# Patient Record
Sex: Female | Born: 1935 | Race: White | Hispanic: No | Marital: Married | Smoking: Never smoker
Health system: Southern US, Community
[De-identification: ages and names within clinical notes are randomized; demographics above are authoritative.]

## PROBLEM LIST (undated history)

## (undated) DIAGNOSIS — Z5189 Encounter for other specified aftercare: Secondary | ICD-10-CM

## (undated) DIAGNOSIS — I10 Essential (primary) hypertension: Secondary | ICD-10-CM

## (undated) DIAGNOSIS — G3184 Mild cognitive impairment, so stated: Secondary | ICD-10-CM

## (undated) DIAGNOSIS — E785 Hyperlipidemia, unspecified: Secondary | ICD-10-CM

## (undated) HISTORY — DX: Mild cognitive impairment of uncertain or unknown etiology: G31.84

## (undated) HISTORY — DX: Essential (primary) hypertension: I10

## (undated) HISTORY — DX: Hyperlipidemia, unspecified: E78.5

## (undated) HISTORY — PX: TONSILLECTOMY: SUR1361

## (undated) HISTORY — DX: Encounter for other specified aftercare: Z51.89

---

## 1962-04-26 DIAGNOSIS — Z5189 Encounter for other specified aftercare: Secondary | ICD-10-CM

## 1962-04-26 HISTORY — DX: Encounter for other specified aftercare: Z51.89

## 1997-07-30 ENCOUNTER — Other Ambulatory Visit: Admission: RE | Admit: 1997-07-30 | Discharge: 1997-07-30 | Payer: Self-pay | Admitting: Obstetrics and Gynecology

## 1997-08-15 ENCOUNTER — Ambulatory Visit (HOSPITAL_COMMUNITY): Admission: RE | Admit: 1997-08-15 | Discharge: 1997-08-15 | Payer: Self-pay | Admitting: Family Medicine

## 1998-11-14 ENCOUNTER — Other Ambulatory Visit: Admission: RE | Admit: 1998-11-14 | Discharge: 1998-11-14 | Payer: Self-pay | Admitting: Family Medicine

## 1999-01-01 ENCOUNTER — Encounter (INDEPENDENT_AMBULATORY_CARE_PROVIDER_SITE_OTHER): Payer: Self-pay | Admitting: Specialist

## 1999-01-01 ENCOUNTER — Other Ambulatory Visit: Admission: RE | Admit: 1999-01-01 | Discharge: 1999-01-01 | Payer: Self-pay | Admitting: Internal Medicine

## 1999-10-30 ENCOUNTER — Encounter: Payer: Self-pay | Admitting: Family Medicine

## 1999-10-30 ENCOUNTER — Encounter: Admission: RE | Admit: 1999-10-30 | Discharge: 1999-10-30 | Payer: Self-pay | Admitting: Family Medicine

## 2000-03-03 ENCOUNTER — Other Ambulatory Visit: Admission: RE | Admit: 2000-03-03 | Discharge: 2000-03-03 | Payer: Self-pay | Admitting: Family Medicine

## 2000-12-19 ENCOUNTER — Encounter: Admission: RE | Admit: 2000-12-19 | Discharge: 2000-12-19 | Payer: Self-pay | Admitting: Family Medicine

## 2000-12-19 ENCOUNTER — Encounter: Payer: Self-pay | Admitting: Family Medicine

## 2000-12-28 ENCOUNTER — Encounter: Admission: RE | Admit: 2000-12-28 | Discharge: 2000-12-28 | Payer: Self-pay | Admitting: Family Medicine

## 2000-12-28 ENCOUNTER — Encounter: Payer: Self-pay | Admitting: Family Medicine

## 2001-04-27 ENCOUNTER — Other Ambulatory Visit: Admission: RE | Admit: 2001-04-27 | Discharge: 2001-04-27 | Payer: Self-pay | Admitting: Obstetrics and Gynecology

## 2001-12-29 ENCOUNTER — Encounter: Admission: RE | Admit: 2001-12-29 | Discharge: 2001-12-29 | Payer: Self-pay | Admitting: Family Medicine

## 2001-12-29 ENCOUNTER — Encounter: Payer: Self-pay | Admitting: Family Medicine

## 2002-06-26 ENCOUNTER — Other Ambulatory Visit: Admission: RE | Admit: 2002-06-26 | Discharge: 2002-06-26 | Payer: Self-pay | Admitting: Family Medicine

## 2002-07-03 ENCOUNTER — Encounter: Payer: Self-pay | Admitting: Family Medicine

## 2002-07-03 ENCOUNTER — Encounter: Admission: RE | Admit: 2002-07-03 | Discharge: 2002-07-03 | Payer: Self-pay | Admitting: Family Medicine

## 2003-04-25 ENCOUNTER — Encounter: Admission: RE | Admit: 2003-04-25 | Discharge: 2003-04-25 | Payer: Self-pay | Admitting: Family Medicine

## 2003-07-17 ENCOUNTER — Other Ambulatory Visit: Admission: RE | Admit: 2003-07-17 | Discharge: 2003-07-17 | Payer: Self-pay | Admitting: Family Medicine

## 2004-01-29 ENCOUNTER — Other Ambulatory Visit: Admission: RE | Admit: 2004-01-29 | Discharge: 2004-01-29 | Payer: Self-pay | Admitting: Obstetrics and Gynecology

## 2004-04-29 ENCOUNTER — Encounter: Admission: RE | Admit: 2004-04-29 | Discharge: 2004-04-29 | Payer: Self-pay | Admitting: Family Medicine

## 2004-07-08 ENCOUNTER — Other Ambulatory Visit: Admission: RE | Admit: 2004-07-08 | Discharge: 2004-07-08 | Payer: Self-pay | Admitting: Addiction Medicine

## 2004-12-01 ENCOUNTER — Other Ambulatory Visit: Admission: RE | Admit: 2004-12-01 | Discharge: 2004-12-01 | Payer: Self-pay | Admitting: Obstetrics and Gynecology

## 2005-05-17 ENCOUNTER — Inpatient Hospital Stay (HOSPITAL_COMMUNITY): Admission: EM | Admit: 2005-05-17 | Discharge: 2005-05-18 | Payer: Self-pay | Admitting: Emergency Medicine

## 2005-05-17 ENCOUNTER — Ambulatory Visit: Payer: Self-pay | Admitting: Cardiology

## 2005-06-01 ENCOUNTER — Encounter: Admission: RE | Admit: 2005-06-01 | Discharge: 2005-06-01 | Payer: Self-pay | Admitting: Family Medicine

## 2005-07-14 ENCOUNTER — Other Ambulatory Visit: Admission: RE | Admit: 2005-07-14 | Discharge: 2005-07-14 | Payer: Self-pay | Admitting: Addiction Medicine

## 2006-07-15 ENCOUNTER — Encounter: Admission: RE | Admit: 2006-07-15 | Discharge: 2006-07-15 | Payer: Self-pay | Admitting: Family Medicine

## 2006-07-20 ENCOUNTER — Other Ambulatory Visit: Admission: RE | Admit: 2006-07-20 | Discharge: 2006-07-20 | Payer: Self-pay | Admitting: Obstetrics and Gynecology

## 2007-05-10 ENCOUNTER — Other Ambulatory Visit: Admission: RE | Admit: 2007-05-10 | Discharge: 2007-05-10 | Payer: Self-pay | Admitting: Family Medicine

## 2007-07-25 ENCOUNTER — Encounter: Admission: RE | Admit: 2007-07-25 | Discharge: 2007-07-25 | Payer: Self-pay | Admitting: Family Medicine

## 2007-09-13 ENCOUNTER — Ambulatory Visit: Payer: Self-pay | Admitting: Internal Medicine

## 2007-09-26 ENCOUNTER — Encounter: Payer: Self-pay | Admitting: Internal Medicine

## 2007-09-26 ENCOUNTER — Ambulatory Visit: Payer: Self-pay | Admitting: Internal Medicine

## 2007-09-28 ENCOUNTER — Encounter: Payer: Self-pay | Admitting: Internal Medicine

## 2008-05-22 ENCOUNTER — Encounter: Admission: RE | Admit: 2008-05-22 | Discharge: 2008-05-22 | Payer: Self-pay | Admitting: Family Medicine

## 2008-07-26 ENCOUNTER — Encounter: Admission: RE | Admit: 2008-07-26 | Discharge: 2008-07-26 | Payer: Self-pay | Admitting: Family Medicine

## 2009-07-29 ENCOUNTER — Encounter: Admission: RE | Admit: 2009-07-29 | Discharge: 2009-07-29 | Payer: Self-pay | Admitting: Family Medicine

## 2009-08-05 ENCOUNTER — Ambulatory Visit: Payer: Self-pay | Admitting: Obstetrics and Gynecology

## 2009-08-06 ENCOUNTER — Ambulatory Visit: Payer: Self-pay | Admitting: Obstetrics and Gynecology

## 2009-08-27 ENCOUNTER — Ambulatory Visit: Payer: Self-pay | Admitting: Obstetrics and Gynecology

## 2009-09-02 ENCOUNTER — Ambulatory Visit: Payer: Self-pay | Admitting: Obstetrics and Gynecology

## 2009-09-02 ENCOUNTER — Ambulatory Visit (HOSPITAL_BASED_OUTPATIENT_CLINIC_OR_DEPARTMENT_OTHER): Admission: RE | Admit: 2009-09-02 | Discharge: 2009-09-02 | Payer: Self-pay | Admitting: Obstetrics and Gynecology

## 2009-09-09 ENCOUNTER — Ambulatory Visit: Payer: Self-pay | Admitting: Obstetrics and Gynecology

## 2010-05-17 ENCOUNTER — Encounter: Payer: Self-pay | Admitting: Family Medicine

## 2010-06-09 ENCOUNTER — Other Ambulatory Visit: Payer: Self-pay | Admitting: Family Medicine

## 2010-06-09 DIAGNOSIS — Z780 Asymptomatic menopausal state: Secondary | ICD-10-CM

## 2010-06-16 ENCOUNTER — Other Ambulatory Visit: Payer: Self-pay

## 2010-06-23 ENCOUNTER — Ambulatory Visit
Admission: RE | Admit: 2010-06-23 | Discharge: 2010-06-23 | Disposition: A | Discharge status: Home / Self Care | Payer: MEDICARE | Source: Ambulatory Visit | Attending: Family Medicine | Admitting: Family Medicine

## 2010-06-23 DIAGNOSIS — Z780 Asymptomatic menopausal state: Secondary | ICD-10-CM

## 2010-07-01 ENCOUNTER — Other Ambulatory Visit: Payer: Self-pay | Admitting: Family Medicine

## 2010-07-01 DIAGNOSIS — Z1231 Encounter for screening mammogram for malignant neoplasm of breast: Secondary | ICD-10-CM

## 2010-07-15 ENCOUNTER — Ambulatory Visit
Admission: RE | Admit: 2010-07-15 | Discharge: 2010-07-15 | Disposition: A | Discharge status: Home / Self Care | Payer: MEDICARE | Source: Ambulatory Visit | Attending: Family Medicine | Admitting: Family Medicine

## 2010-07-15 DIAGNOSIS — Z1231 Encounter for screening mammogram for malignant neoplasm of breast: Secondary | ICD-10-CM

## 2011-06-09 ENCOUNTER — Other Ambulatory Visit: Payer: Self-pay | Admitting: Family Medicine

## 2011-06-09 DIAGNOSIS — Z1231 Encounter for screening mammogram for malignant neoplasm of breast: Secondary | ICD-10-CM

## 2011-07-21 ENCOUNTER — Ambulatory Visit
Admission: RE | Admit: 2011-07-21 | Discharge: 2011-07-21 | Disposition: A | Discharge status: Home / Self Care | Payer: Medicare Other | Source: Ambulatory Visit | Attending: Family Medicine | Admitting: Family Medicine

## 2011-07-21 DIAGNOSIS — Z1231 Encounter for screening mammogram for malignant neoplasm of breast: Secondary | ICD-10-CM

## 2011-07-26 ENCOUNTER — Other Ambulatory Visit: Payer: Self-pay | Admitting: Family Medicine

## 2011-07-26 DIAGNOSIS — R928 Other abnormal and inconclusive findings on diagnostic imaging of breast: Secondary | ICD-10-CM

## 2011-07-30 ENCOUNTER — Ambulatory Visit
Admission: RE | Admit: 2011-07-30 | Discharge: 2011-07-30 | Disposition: A | Discharge status: Home / Self Care | Payer: Medicare Other | Source: Ambulatory Visit | Attending: Family Medicine | Admitting: Family Medicine

## 2011-07-30 DIAGNOSIS — R928 Other abnormal and inconclusive findings on diagnostic imaging of breast: Secondary | ICD-10-CM

## 2012-07-04 ENCOUNTER — Other Ambulatory Visit: Payer: Self-pay

## 2012-07-04 DIAGNOSIS — Z1231 Encounter for screening mammogram for malignant neoplasm of breast: Secondary | ICD-10-CM

## 2012-08-08 ENCOUNTER — Ambulatory Visit: Payer: Medicare Other

## 2012-08-08 ENCOUNTER — Ambulatory Visit
Admission: RE | Admit: 2012-08-08 | Discharge: 2012-08-08 | Disposition: A | Discharge status: Home / Self Care | Payer: Medicare Other | Source: Ambulatory Visit

## 2012-08-08 DIAGNOSIS — Z1231 Encounter for screening mammogram for malignant neoplasm of breast: Secondary | ICD-10-CM

## 2012-09-12 ENCOUNTER — Ambulatory Visit: Payer: Medicare Other

## 2012-11-08 ENCOUNTER — Encounter: Payer: Self-pay | Admitting: Internal Medicine

## 2013-03-06 ENCOUNTER — Encounter: Payer: Self-pay | Admitting: Internal Medicine

## 2013-04-13 ENCOUNTER — Ambulatory Visit (AMBULATORY_SURGERY_CENTER): Payer: Self-pay | Admitting: *Deleted

## 2013-04-13 VITALS — Ht 59.0 in | Wt 112.6 lb

## 2013-04-13 DIAGNOSIS — Z86010 Personal history of colonic polyps: Secondary | ICD-10-CM

## 2013-04-13 MED ORDER — MOVIPREP 100 G PO SOLR: ORAL | Status: DC

## 2013-04-13 NOTE — Progress Notes (Signed)
No allergies to eggs or soy. No problems with anesthesia.  

## 2013-04-16 ENCOUNTER — Encounter: Payer: Self-pay | Admitting: Internal Medicine

## 2013-05-09 ENCOUNTER — Telehealth: Payer: Self-pay | Admitting: Internal Medicine

## 2013-05-09 NOTE — Telephone Encounter (Signed)
Per Alesia BandaSherri Jones RN, ok to stay on clear liquids and then drink 2nd half of prep in morning at 330 am like instructions states, called pt back to advise, pt states verbalized understanding of instructions. Also reiterated, not to drink anything after 530 am in the morning, all medications have to be taken before cut off time. Pt states understanding-adm

## 2013-05-09 NOTE — Telephone Encounter (Signed)
Called pt back to confirm information, pt drank 1st dose of prep starting at 330 am this morning, she was not suppose to start prep until this evening. Pt states she has been on clear liquids all day, pt reports last stool was dark watery brown, should pt stay on clear liquids and drink 2nd dose at 330 am tomorrow morning 05/10/13 as stated in directions or do you want her to do something different tonight? Please advise-adm

## 2013-05-10 ENCOUNTER — Ambulatory Visit (AMBULATORY_SURGERY_CENTER): Payer: Medicare HMO | Admitting: Internal Medicine

## 2013-05-10 ENCOUNTER — Encounter: Payer: Self-pay | Admitting: Internal Medicine

## 2013-05-10 VITALS — BP 135/66 | HR 72 | Temp 98.7°F | Resp 20 | Ht 59.0 in | Wt 112.0 lb

## 2013-05-10 DIAGNOSIS — Z86010 Personal history of colonic polyps: Secondary | ICD-10-CM

## 2013-05-10 DIAGNOSIS — Z800 Family history of malignant neoplasm of digestive organs: Secondary | ICD-10-CM

## 2013-05-10 MED ORDER — SODIUM CHLORIDE 0.9 % IV SOLN: 500.0000 mL | INTRAVENOUS | Status: DC

## 2013-05-10 NOTE — Progress Notes (Signed)
A/ox3 pleased with MAC, report to Annette RN 

## 2013-05-10 NOTE — Progress Notes (Signed)
No complaints noted in the recovery room. Maw   

## 2013-05-10 NOTE — Patient Instructions (Signed)
YOU HAD AN ENDOSCOPIC PROCEDURE TODAY AT THE [facility]: Refer to the procedure report that was given to you for any specific questions about what was found during the examination.  If the procedure report does not answer your questions, please call your gastroenterologist to clarify.  If you requested that your care partner not be given the details of your procedure findings, then the procedure report has been included in a sealed envelope for you to review at your convenience later.  YOU SHOULD EXPECT: Some feelings of bloating in the abdomen. Passage of more gas than usual.  Walking can help get rid of the air that was put into your GI tract during the procedure and reduce the bloating. If you had a lower endoscopy (such as a colonoscopy or flexible sigmoidoscopy) you may notice spotting of blood in your stool or on the toilet paper. If you underwent a bowel prep for your procedure, then you may not have a normal bowel movement for a few days.  DIET: Your first meal following the procedure should be a light meal and then it is ok to progress to your normal diet.  A half-sandwich or bowl of soup is an example of a good first meal.  Heavy or fried foods are harder to digest and may make you feel nauseous or bloated.  Likewise meals heavy in dairy and vegetables can cause extra gas to form and this can also increase the bloating.  Drink plenty of fluids but you should avoid alcoholic beverages for 24 hours.  ACTIVITY: Your care partner should take you home directly after the procedure.  You should plan to take it easy, moving slowly for the rest of the day.  You can resume normal activity the day after the procedure however you should NOT DRIVE or use heavy machinery for 24 hours (because of the sedation medicines used during the test).    SYMPTOMS TO REPORT IMMEDIATELY: A gastroenterologist can be reached at any hour.  During normal business hours, 8:30 AM to 5:00 PM Monday through Friday,  call [phone].  After hours and on weekends, please call the GI answering service at [phone] who will take a message and have the physician on call contact you.   Following lower endoscopy (colonoscopy or flexible sigmoidoscopy):  Excessive amounts of blood in the stool  Significant tenderness or worsening of abdominal pains  Swelling of the abdomen that is new, acute  Fever of 100F or higher    FOLLOW UP: If any biopsies were taken you will be contacted by phone or by letter within the next 1-3 weeks.  Call your gastroenterologist if you have not heard about the biopsies in 3 weeks.  Our staff will call the home number listed on your records the next business day following your procedure to check on you and address any questions or concerns that you may have at that time regarding the information given to you following your procedure. This is a courtesy call and so if there is no answer at the home number and we have not heard from you through the emergency physician on call, we will assume that you have returned to your regular daily activities without incident.  SIGNATURES/CONFIDENTIALITY: You and/or your care partner have signed paperwork which will be entered into your electronic medical record.  These signatures attest to the fact that that the information above on your After Visit Summary has been reviewed and is understood.  Full responsibility of the confidentiality   of this discharge information lies with you and/or your care-partner.   Handouts were given to your care partner on diverticulosis and a high fiber diet. You may resume your current medications today. Please call if any questions or concerns.

## 2013-05-10 NOTE — Op Note (Signed)
[facility] [address] [place], [id number]   COLONOSCOPY PROCEDURE REPORT  PATIENT: Cathy RiegerSarwi, Sung A.  MR#: [id number] BIRTHDATE: 11/30/1935 , 77  yrs. old GENDER: Female ENDOSCOPIST: Roxy CedarJohn N Tomaz Janis Jr, MD REFERRED XB:[id number]:Surveillance Program Recall PROCEDURE DATE:  05/10/2013 PROCEDURE:   Colonoscopy, surveillance First Screening Colonoscopy - Avg.  risk and is 50 yrs.  old or older - No.  Prior Negative Screening - Now for repeat screening. N/A  History of Adenoma - Now for follow-up colonoscopy & has been > or = to 3 yrs.  Yes hx of adenoma.  Has been 3 or more years since last colonoscopy.  Polyps Removed Today? No.  Recommend repeat exam, <10 yrs? No. ASA CLASS:   Class II INDICATIONS:Patient's immediate family history of colon cancer (Brother 7550's) and Patient's personal history of adenomatous colon polyps. Index 200 w/ TVA; F/U 2004, 2009 w/ TAs. MEDICATIONS: MAC sedation, administered by CRNA and propofol (Diprivan) 150mg  IV  DESCRIPTION OF PROCEDURE:   After the risks benefits and alternatives of the procedure were thoroughly explained, informed consent was obtained.  A digital rectal exam revealed no abnormalities of the rectum.   The LB [id number]  endoscope was introduced through the anus and advanced to the cecum, which was identified by both the appendix and ileocecal valve. No adverse events experienced.   The quality of the prep was excellent, using MoviPrep  The instrument was then slowly withdrawn as the colon was fully examined.  COLON FINDINGS: Moderate diverticulosis was noted in the sigmoid colon.   The colon was otherwise normal.  There was no inflammation, polyps or cancers unless previously stated. Retroflexed views revealed internal hemorrhoids. The time to cecum=3 minutes 14 seconds.  Withdrawal time=6 minutes 49 seconds. The scope was withdrawn and the procedure completed.  COMPLICATIONS: There were no  complications.  ENDOSCOPIC IMPRESSION: 1.   Moderate diverticulosis was noted in the sigmoid colon 2.   The colon was otherwise normal  RECOMMENDATIONS: 1. Return to the care of your primary provider.  GI follow up as needed   eSigned:  Roxy CedarJohn N Breshae Belcher Jr, MD 05/10/2013 9:08 AM   cc: Halina MaidensSheila C Stallings, MD and The Patient

## 2013-05-11 ENCOUNTER — Telehealth: Payer: Self-pay | Admitting: *Deleted

## 2013-05-11 NOTE — Telephone Encounter (Signed)
  Follow up Call-  Call back number 05/10/2013  Post procedure Call Back phone  # [phone]  Permission to leave phone message Yes     Patient questions:  Do you have a fever, pain , or abdominal swelling? no Pain Score  0 *  Have you tolerated food without any problems? yes  Have you been able to return to your normal activities? yes  Do you have any questions about your discharge instructions: Diet   no Medications  no Follow up visit  no  Do you have questions or concerns about your Care? no  Actions: * If pain score is 4 or above: No action needed, pain <4.

## 2013-06-14 ENCOUNTER — Telehealth: Payer: Self-pay

## 2013-06-14 NOTE — Telephone Encounter (Signed)
i would be happy to see her.

## 2013-06-14 NOTE — Telephone Encounter (Signed)
Mrs. Cathy FerronBarbara Slocumb would like to become one of your patients, she is now seeing Dr. Creta LevinStallings which is leaving her practice. [name] was a patient of yours. Could someone call and let her know if this is possible. She just had her complete CPE last week and needs to let the practice know where to send her records.

## 2013-06-14 NOTE — Telephone Encounter (Signed)
Pt husband was a patient of yours before he passed away. If not willing to see patient she would be fine with seeing a woman physician.

## 2013-06-15 NOTE — Telephone Encounter (Signed)
Can you please call and schedule this patient for a New Patient appt. Thank you

## 2013-07-12 ENCOUNTER — Other Ambulatory Visit: Payer: Self-pay

## 2013-07-12 DIAGNOSIS — Z1231 Encounter for screening mammogram for malignant neoplasm of breast: Secondary | ICD-10-CM

## 2013-08-10 ENCOUNTER — Ambulatory Visit
Admission: RE | Admit: 2013-08-10 | Discharge: 2013-08-10 | Disposition: A | Discharge status: Home / Self Care | Payer: Commercial Managed Care - HMO | Source: Ambulatory Visit

## 2013-08-10 DIAGNOSIS — Z1231 Encounter for screening mammogram for malignant neoplasm of breast: Secondary | ICD-10-CM

## 2013-09-19 ENCOUNTER — Ambulatory Visit: Payer: Medicare HMO | Admitting: Internal Medicine

## 2013-12-26 ENCOUNTER — Encounter: Payer: Self-pay | Admitting: Internal Medicine

## 2014-06-17 ENCOUNTER — Other Ambulatory Visit: Payer: Self-pay | Admitting: Family Medicine

## 2014-06-17 DIAGNOSIS — Z780 Asymptomatic menopausal state: Secondary | ICD-10-CM

## 2014-06-17 DIAGNOSIS — M8580 Other specified disorders of bone density and structure, unspecified site: Secondary | ICD-10-CM

## 2014-06-17 DIAGNOSIS — Z1231 Encounter for screening mammogram for malignant neoplasm of breast: Secondary | ICD-10-CM

## 2014-08-12 ENCOUNTER — Ambulatory Visit
Admission: RE | Admit: 2014-08-12 | Discharge: 2014-08-12 | Disposition: A | Discharge status: Home / Self Care | Payer: Commercial Managed Care - HMO | Source: Ambulatory Visit | Attending: Family Medicine | Admitting: Family Medicine

## 2014-08-12 DIAGNOSIS — M8580 Other specified disorders of bone density and structure, unspecified site: Secondary | ICD-10-CM

## 2014-08-12 DIAGNOSIS — Z780 Asymptomatic menopausal state: Secondary | ICD-10-CM

## 2014-08-12 DIAGNOSIS — Z1231 Encounter for screening mammogram for malignant neoplasm of breast: Secondary | ICD-10-CM

## 2014-12-12 ENCOUNTER — Other Ambulatory Visit: Payer: Self-pay | Admitting: Family Medicine

## 2014-12-12 DIAGNOSIS — R413 Other amnesia: Secondary | ICD-10-CM

## 2014-12-12 DIAGNOSIS — W19XXXA Unspecified fall, initial encounter: Secondary | ICD-10-CM

## 2014-12-17 ENCOUNTER — Ambulatory Visit
Admission: RE | Admit: 2014-12-17 | Discharge: 2014-12-17 | Disposition: A | Discharge status: Home / Self Care | Payer: Commercial Managed Care - HMO | Source: Ambulatory Visit | Attending: Family Medicine | Admitting: Family Medicine

## 2014-12-17 DIAGNOSIS — W19XXXA Unspecified fall, initial encounter: Secondary | ICD-10-CM

## 2014-12-17 DIAGNOSIS — R413 Other amnesia: Secondary | ICD-10-CM

## 2015-07-17 ENCOUNTER — Other Ambulatory Visit: Payer: Self-pay | Admitting: Family Medicine

## 2015-07-17 DIAGNOSIS — M7989 Other specified soft tissue disorders: Secondary | ICD-10-CM

## 2015-07-18 ENCOUNTER — Ambulatory Visit
Admission: RE | Admit: 2015-07-18 | Discharge: 2015-07-18 | Disposition: A | Discharge status: Home / Self Care | Payer: Commercial Managed Care - HMO | Source: Ambulatory Visit | Attending: Family Medicine | Admitting: Family Medicine

## 2015-07-18 DIAGNOSIS — M7989 Other specified soft tissue disorders: Secondary | ICD-10-CM

## 2015-11-07 LAB — GLUCOSE, POCT (MANUAL RESULT ENTRY): POC GLUCOSE: 106 mg/dL — AB (ref 70–99)

## 2016-06-18 DIAGNOSIS — H903 Sensorineural hearing loss, bilateral: Secondary | ICD-10-CM

## 2016-06-18 DIAGNOSIS — H6123 Impacted cerumen, bilateral: Secondary | ICD-10-CM | POA: Insufficient documentation

## 2016-06-18 HISTORY — DX: Sensorineural hearing loss, bilateral: H90.3

## 2017-07-20 ENCOUNTER — Encounter: Payer: Self-pay | Admitting: Neurology

## 2017-09-14 ENCOUNTER — Ambulatory Visit: Payer: Medicare Other | Admitting: Neurology

## 2017-09-14 ENCOUNTER — Other Ambulatory Visit: Payer: Self-pay

## 2017-09-14 ENCOUNTER — Encounter: Payer: Self-pay | Admitting: Neurology

## 2017-09-14 VITALS — BP 138/66 | HR 80 | Ht 59.0 in | Wt 116.0 lb

## 2017-09-14 DIAGNOSIS — R413 Other amnesia: Secondary | ICD-10-CM | POA: Diagnosis not present

## 2017-09-14 NOTE — Patient Instructions (Signed)
Great meeting you. Follow-up in 6 months, call for any changes  RECOMMENDATIONS FOR ALL PATIENTS WITH MEMORY PROBLEMS: 1. Continue to exercise (Recommend 30 minutes of walking everyday, or 3 hours every week) 2. Increase social interactions - continue going to Church and enjoy social gatherings with friends and family 3. Eat healthy, avoid fried foods and eat more fruits and vegetables 4. Maintain adequate blood pressure, blood sugar, and blood cholesterol level. Reducing the risk of stroke and cardiovascular disease also helps promoting better memory. 5. Avoid stressful situations. Live a simple life and avoid aggravations. Organize your time and prepare for the next day in anticipation. 6. Sleep well, avoid any interruptions of sleep and avoid any distractions in the bedroom that may interfere with adequate sleep quality 7. Avoid sugar, avoid sweets as there is a strong link between excessive sugar intake, diabetes, and cognitive impairment We discussed the Mediterranean diet, which has been shown to help patients reduce the risk of progressive memory disorders and reduces cardiovascular risk. This includes eating fish, eat fruits and green leafy vegetables, nuts like almonds and hazelnuts, walnuts, and also use olive oil. Avoid fast foods and fried foods as much as possible. Avoid sweets and sugar as sugar use has been linked to worsening of memory function.   

## 2017-09-14 NOTE — Progress Notes (Signed)
NEUROLOGY CONSULTATION NOTE  Cathy Grimes MRN: [id number] DOB: 03/07/1936  Referring provider: Dr. Shirlean Mylar Primary care provider: Dr. Shirlean Mylar  Reason for consult:  Memory loss  Dear Dr Hyman Hopes:  Thank you for your kind referral of Cathy Grimes for consultation of the above symptoms. Although her history is well known to you, please allow me to reiterate it for the purpose of our medical record. She is alone in the office today. Records and images were personally reviewed where available.  HISTORY OF PRESENT ILLNESS: This is a pleasant 82 year old right-handed woman with a history of hypertension, hyperlipidemia, presenting for evaluation of memory loss. She notes her memory at times does bother her. Her son has been staying with her for a few months and would remind her that they had already seen the same show a few weeks ago. Her children would tell her she repeats herself. She lives alone and denies getting lost driving, no missed bills or medications. No word-finding difficulties. She denies misplacing things frequently or leaving the stove on. She is active and exercises regularly. No report of personality changes, she denies any hallucinations. She is independent with all ADLs. MMSE at PCP office in March 2019 was 23/30.  She has noticed her sense of smell is not as keen as others, otherwise she denies any headaches, dizziness, diplopia, dysarthria/dysphagia, neck/back pain, focal numbness/tingling/weakness, bowel/bladder dysfunction, or tremors. There is no family history of dementia. She denies any history of significant head injuries. She drinks alcohol socially.   Laboratory Data: TSH and B12 done at PCP office, report unavailable for review  PAST MEDICAL HISTORY: Past Medical History:  Diagnosis Date  . Blood transfusion without reported diagnosis 1964   after miscarriage  . Hyperlipidemia   . Hypertension     PAST SURGICAL HISTORY: Past Surgical History:    Procedure Laterality Date  . TONSILLECTOMY      MEDICATIONS: Current Outpatient Medications on File Prior to Visit  Medication Sig Dispense Refill  . Atorvastatin Calcium (LIPITOR PO) Take by mouth at bedtime.    . Calcium Carbonate-Vitamin D (CALCIUM + D PO) Take by mouth 2 (two) times daily.    . Multiple Vitamin (MULTIVITAMIN) tablet Take 1 tablet by mouth daily.    . Omega-3 Fatty Acids (FISH OIL PO) Take by mouth 3 (three) times daily.    Marland Kitchen oxybutynin (DITROPAN) 5 MG tablet Take 5 mg by mouth 3 (three) times daily.    . sertraline (ZOLOFT) 50 MG tablet     . telmisartan (MICARDIS) 40 MG tablet TK 1 T PO QD  0   No current facility-administered medications on file prior to visit.     ALLERGIES: No Known Allergies  FAMILY HISTORY: Family History  Problem Relation Age of Onset  . Colon cancer Neg Hx     SOCIAL HISTORY: Social History   Socioeconomic History  . Marital status: Married    Spouse name: Not on file  . Number of children: Not on file  . Years of education: Not on file  . Highest education level: Not on file  Occupational History  . Not on file  Social Needs  . Financial resource strain: Not on file  . Food insecurity:    Worry: Not on file    Inability: Not on file  . Transportation needs:    Medical: Not on file    Non-medical: Not on file  Tobacco Use  . Smoking status: Never Smoker  .  Smokeless tobacco: Never Used  Substance and Sexual Activity  . Alcohol use: Yes    Alcohol/week: 8.4 oz    Types: 14 Glasses of wine per week  . Drug use: No  . Sexual activity: Not on file  Lifestyle  . Physical activity:    Days per week: Not on file    Minutes per session: Not on file  . Stress: Not on file  Relationships  . Social connections:    Talks on phone: Not on file    Gets together: Not on file    Attends religious service: Not on file    Active member of club or organization: Not on file    Attends meetings of clubs or organizations: Not  on file    Relationship status: Not on file  . Intimate partner violence:    Fear of current or ex partner: Not on file    Emotionally abused: Not on file    Physically abused: Not on file    Forced sexual activity: Not on file  Other Topics Concern  . 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    REVIEW OF SYSTEMS: Constitutional: No fevers, chills, or sweats, no generalized fatigue, change in appetite Eyes: No visual changes, double vision, eye pain Ear, nose and throat: No hearing loss, ear pain, nasal congestion, sore throat Cardiovascular: No chest pain, palpitations Respiratory:  No shortness of breath at rest or with exertion, wheezes GastrointestinaI: No nausea, vomiting, diarrhea, abdominal pain, fecal incontinence Genitourinary:  No dysuria, urinary retention or frequency Musculoskeletal:  No neck pain, back pain Integumentary: No rash, pruritus, skin lesions Neurological: as above Psychiatric: No depression, insomnia, anxiety Endocrine: No palpitations, fatigue, diaphoresis, mood swings, change in appetite, change in weight, increased thirst Hematologic/Lymphatic:  No anemia, purpura, petechiae. Allergic/Immunologic: no itchy/runny eyes, nasal congestion, recent allergic reactions, rashes  PHYSICAL EXAM: Vitals:   09/14/17 0855  BP: 138/66  Pulse: 80  SpO2: 95%   General: No acute distress Head:  Normocephalic/atraumatic Eyes: Fundoscopic exam shows bilateral sharp discs, no vessel changes, exudates, or hemorrhages Neck: supple, no paraspinal tenderness, full range of motion Back: No paraspinal tenderness Heart: regular rate and rhythm Lungs: Clear to auscultation bilaterally. Vascular: No carotid bruits. Skin/Extremities: No rash, no edema Neurological Exam: Mental status: alert and oriented to person, place, and time, no dysarthria or aphasia, Fund of knowledge is  appropriate.  Recent and remote memory are intact.  Attention and concentration are normal.    Able to name objects and repeat phrases.  Montreal Cognitive Assessment  09/14/2017  Visuospatial/ Executive (0/5) 4  Naming (0/3) 3  Attention: Read list of digits (0/2) 2  Attention: Read list of letters (0/1) 1  Attention: Serial 7 subtraction starting at 100 (0/3) 3  Language: Repeat phrase (0/2) 2  Language : Fluency (0/1) 1  Abstraction (0/2) 2  Delayed Recall (0/5) 2  Orientation (0/6) 6  Total 26   Cranial nerves: CN I: not tested CN II: pupils equal, round and reactive to light, visual fields intact, fundi unremarkable. CN III, IV, VI:  full range of motion, no nystagmus, no ptosis CN V: facial sensation intact CN VII: upper and lower face symmetric CN VIII: hearing intact to finger rub CN IX, X: gag intact, uvula midline CN XI: sternocleidomastoid and trapezius muscles intact CN XII: tongue midline Bulk & Tone:  normal, no fasciculations. Motor: 5/5 throughout with no pronator drift. Sensation: intact to light touch, cold, pin, vibration and joint position sense.  No extinction to double simultaneous stimulation.  Romberg test negative Deep Tendon Reflexes: +2 throughout, no ankle clonus Plantar responses: downgoing bilaterally Cerebellar: no incoordination on finger to nose, heel to shin. No dysdiadochokinesia Gait: narrow-based and steady, mild difficulty with tandem walk but able Tremor: none  IMPRESSION: This is a pleasant 82 year old right-handed woman with a history of hypertension, hyperlipidemia, presenting for evaluation of memory loss. Her neurological exam is non-focal, MOCA score today normal 26/30. She denies any difficulties with complex tasks, symptoms suggestive of age-related memory loss. We discussed medications such as cholinesterase inhibitors, side effects and expectations, and have agreed to monitor memory for now and if any decline on next visit, we will plan  to start medication then. We discussed the importance of control of vascular risk factors, physical exercise, and brain stimulation exercises for brain health. She will follow-up in 6 months and knows to call for any changes.  Thank you for allowing me to participate in the care of this patient. Please do not hesitate to call for any questions or concerns.   Patrcia Dolly, M.D.  CC: Dr. Hyman Hopes

## 2018-04-05 ENCOUNTER — Ambulatory Visit: Payer: Medicare Other | Admitting: Neurology

## 2018-08-10 ENCOUNTER — Ambulatory Visit: Payer: Medicare Other | Admitting: Neurology

## 2019-03-21 ENCOUNTER — Ambulatory Visit: Payer: Medicare Other | Admitting: Neurology

## 2019-03-21 ENCOUNTER — Encounter

## 2019-03-26 ENCOUNTER — Telehealth (INDEPENDENT_AMBULATORY_CARE_PROVIDER_SITE_OTHER): Payer: Medicare Other | Admitting: Neurology

## 2019-03-26 ENCOUNTER — Encounter: Payer: Self-pay | Admitting: Neurology

## 2019-03-26 ENCOUNTER — Other Ambulatory Visit: Payer: Self-pay

## 2019-03-26 VITALS — Ht 59.0 in

## 2019-03-26 DIAGNOSIS — G3184 Mild cognitive impairment, so stated: Secondary | ICD-10-CM

## 2019-03-26 DIAGNOSIS — R413 Other amnesia: Secondary | ICD-10-CM | POA: Diagnosis not present

## 2019-03-26 MED ORDER — DONEPEZIL HCL 5 MG PO TABS: ORAL_TABLET | ORAL | 11 refills | Status: DC
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
## 2019-05-25 ENCOUNTER — Ambulatory Visit: Payer: Medicare Other

## 2019-05-30 ENCOUNTER — Ambulatory Visit: Payer: Medicare Other

## 2019-05-31 ENCOUNTER — Ambulatory Visit: Payer: Medicare Other | Attending: Internal Medicine

## 2019-05-31 DIAGNOSIS — Z23 Encounter for immunization: Secondary | ICD-10-CM | POA: Insufficient documentation

## 2019-05-31 NOTE — Progress Notes (Signed)
   Covid-19 Vaccination Clinic  Name:  Cathy Grimes    MRN: [id number] DOB: [birth year]  05/31/2019  [name] was observed post Covid-19 immunization for 15 minutes without incidence. She was provided with Vaccine Information Sheet and instruction to access the V-Safe system.   [name] was instructed to call 911 with any severe reactions post vaccine: Marland Kitchen Difficulty breathing  . Swelling of your face and throat  . A fast heartbeat  . A bad rash all over your body  . Dizziness and weakness    Immunizations Administered    Name Date Dose VIS Date Route   Pfizer COVID-19 Vaccine 05/31/2019  9:51 AM 0.3 mL 04/06/2019 Intramuscular   Manufacturer: [employer], [employer]   Lot: [id number]   NDC: [address]-9

## 2019-06-06 ENCOUNTER — Ambulatory Visit (INDEPENDENT_AMBULATORY_CARE_PROVIDER_SITE_OTHER): Payer: Medicare Other | Admitting: Psychology

## 2019-06-06 ENCOUNTER — Encounter: Payer: Self-pay | Admitting: Psychology

## 2019-06-06 ENCOUNTER — Ambulatory Visit: Payer: Medicare Other | Admitting: Psychology

## 2019-06-06 ENCOUNTER — Other Ambulatory Visit: Payer: Self-pay

## 2019-06-06 DIAGNOSIS — G3184 Mild cognitive impairment, so stated: Secondary | ICD-10-CM

## 2019-06-06 DIAGNOSIS — R413 Other amnesia: Secondary | ICD-10-CM

## 2019-06-06 DIAGNOSIS — E785 Hyperlipidemia, unspecified: Secondary | ICD-10-CM

## 2019-06-06 DIAGNOSIS — I10 Essential (primary) hypertension: Secondary | ICD-10-CM

## 2019-06-06 NOTE — Progress Notes (Addendum)
NEUROPSYCHOLOGICAL EVALUATION [place]. [facility] Department of Neurology  Reason for Referral:   Cathy RiegerBarbara A Grimes is a 84 y.o. Caucasian female referred by Patrcia DollyKaren Aquino, M.D., to characterize her current cognitive functioning and assist with diagnostic clarity and treatment planning in the context of subjective cognitive decline.  Assessment and Plan:   Clinical Impression(s): Ms. Valentino Grimes's pattern of performance is suggestive of a primary impairment surrounding some aspects of executive functioning (namely visuomotor cognitive flexibility and response inhibition) and information retrieval across memory measures. An additional isolated impairment was seen across a task assessing line orientations. Performance was appropriate across domains of processing speed, attention/concentration, semantic fluency set shifting, receptive and expressive language, other aspects of visuospatial functioning, and both encoding and consolidation aspects of memory. Cathy Grimes largely denied difficulties completing instrumental activities of daily living (ADLs) independently. As such, given cognitive dysfunction described above, she meets criteria for a Mild Neurocognitive Disorder (formerly "mild cognitive impairment") at the present time.  The etiology for her weaknesses, especially those related to memory retrieval deficits is unclear. Neuroimaging suggesting small vessel ischemia can sometimes create difficulties in this area. While a head CT in 2016 suggested minimal small vessel disease, more recent neuroimaging was not found during record review. Cathy Grimes denied significant psychiatric distress, as well as symptoms of chronic pain, frequent headaches, or sleep disturbances which can also cause mild cognitive inefficiencies. Importantly, despite poor retrieval rates across memory measures, her consolidation scores were largely appropriate. This, accompanied by strong performances in other  cognitive domains, is not consistent with the typical presentation of Alzheimer's disease. Cognitive and behavioral characteristics are also not strongly consistent with other forms of neurodegenerative illness at the present time. Continued medical monitoring will be important moving forward.   Recommendations: A repeat neuropsychological evaluation in 18-24 months (or sooner if functional decline is noted) is recommended to assess the trajectory of future cognitive decline should it occur. This will also aid in future efforts towards improved diagnostic clarity.  If not already performed, updated neuroimaging (ideally a brain MRI) would be beneficial in assessing anatomical correlates for her pattern of weaknesses across testing, as well as the potential progression of small vessel ischemia.   Cathy Grimes is encouraged to attend to lifestyle factors for brain health (e.g., regular physical exercise, good nutrition habits, regular participation in cognitively-stimulating activities, and general stress management techniques), which are likely to have benefits for both emotional adjustment and cognition. Optimal control of vascular risk factors (including safe cardiovascular exercise and adherence to dietary recommendations) is encouraged. Continued participation in activities which provide mental stimulation and social interaction is also recommended.   For day-to-day problems recalling information, she may benefit from using strategies to aid with her learning and memory, such as asking questions for clarification, requesting that information to be repeated, or repeating an explanation in her own words to ensure comprehension and promote encoding. Reducing anxiety/stress may also aid in the retrieval of information.  Memory can be improved using internal strategies such as rehearsal, repetition, chunking, mnemonics, association, and imagery. External strategies such as written notes in a consistently used  memory journal, visual and nonverbal auditory cues such as a calendar on the refrigerator or appointments with alarm, such as on a cell phone, can also help maximize recall.    To address problems with executive functioning, she may wish to consider:   -Avoiding external distractions when needing to concentrate   -Limiting exposure to fast paced environments with multiple sensory demands   -  Writing down complicated information and using checklists   -Attempting and completing one task at a time (i.e., no multi-tasking)   -Verbalizing aloud each step of a task to maintain focus   -Reducing the amount of information considered at one time  Review of Records:   Cathy Grimes was seen by [facility] Marland KitchenEllouise Newer, M.D.) on 03/26/2019 for follow-up of memory concerns. Since her prior visit, Cathy Grimes described her memory as "sometimes good, sometimes bad." Her daughter noted that her mother has been repeating the same question and has experienced generalized forgetfulness for the past 6-12 months. ADLs were described as intact. Performance across a brief cognitive screening instrument over the phone (abbreviated SLUMS) was 21/24. Performance on a previous MOCA was 26/30. Ultimately, Cathy Grimes was referred for a comprehensive neuropsychological evaluation to characterize her cognitive abilities and to assist with diagnostic clarity and treatment planning.  Head CT on 12/17/2014 revealed mild atrophy and minimal chronic small vessel white matter ischemic changes.   Past Medical History:  Diagnosis Date  . Blood transfusion without reported diagnosis 1964   after miscarriage  . Hyperlipidemia   . Hypertension   . Sensorineural hearing loss (SNHL) of both ears 06/18/2016    Past Surgical History:  Procedure Laterality Date  . TONSILLECTOMY      Current Outpatient Medications:  .  aspirin EC 81 MG tablet, Take 81 mg by mouth daily., Disp: , Rfl:  .  Atorvastatin Calcium (LIPITOR PO), Take by  mouth at bedtime., Disp: , Rfl:  .  Calcium Carbonate-Vitamin D (CALCIUM + D PO), Take by mouth 2 (two) times daily., Disp: , Rfl:  .  donepezil (ARICEPT) 5 MG tablet, Take 1/2 tablet daily for 2 weeks, then increase to 1 tablet daily, Disp: 30 tablet, Rfl: 11 .  Multiple Vitamin (MULTIVITAMIN) tablet, Take 1 tablet by mouth daily., Disp: , Rfl:  .  Omega-3 Fatty Acids (FISH OIL PO), Take by mouth 3 (three) times daily., Disp: , Rfl:   Clinical Interview:   Cognitive Symptoms: Decreased short-term memory: Endorsed. She described symptoms of generalized forgetfulness, including trouble remembering upcoming appointments if she does not write them down. She denied trouble remembering details of previous conversations or the names of familiar individuals. She estimated that memory changes have been ongoing for the past 1-2 years and have seemed stable over that time.  Decreased long-term memory: Denied. Decreased attention/concentration: Denied. She did acknowledge a longstanding weakness with distractibility, but denied any changes over time.  Reduced processing speed: Denied. Difficulties with executive functions: Denied. She did acknowledge a longstanding weakness with impulsivity at times throughout her life, but denied any changes over time. Difficulties with emotion regulation: Denied. Difficulties with receptive language: Denied under the assumption that she can hear the source of the sound appropriately.  Difficulties with word finding: Denied. Decreased visuoperceptual ability: Denied.  Difficulties completing ADLs: Denied. However, she did acknowledge not being on top of bill paying lately relative to her past. She denied missed payments, but noting more "scrambling" as due dates near.   Additional Medical History: History of traumatic brain injury/concussion: Denied. History of stroke: Denied. History of seizure activity: Denied. History of known exposure to toxins: Denied. Symptoms  of chronic pain: Denied. Experience of frequent headaches/migraines: Denied. Frequent instances of dizziness/vertigo: Denied.  Sensory changes: She reported having cataract surgery in the past and currently uses glasses with positive effect. She also reported a history of hearing loss and wears hearing aids. Other sensory changes/difficulties (e.g., taste  or smell) were denied. Balance/coordination difficulties: Denied. She also denied a history of falls. Other motor difficulties: Denied.  Sleep History: Estimated hours obtained each night: 8+ hours. Difficulties falling asleep: Denied. Difficulties staying asleep: Denied outside of occasionally waking to use the restroom. Feels rested and refreshed upon awakening: Endorsed.  History of snoring: Endorsed. History of waking up gasping for air: Denied. Witnessed breath cessation while asleep: Denied.  History of vivid dreaming: Denied. Excessive movement while asleep: Denied. Instances of acting out her dreams: Denied.  Psychiatric/Behavioral Health History: Depression: Denied. She described her current mood as "pretty good" and, to her knowledge, denied ever being formally diagnosed with depression in the past. Current or remote suicidal ideation, intent, or plan was likewise denied. Anxiety: Denied. Mania: Denied. Trauma History: Denied. Visual/auditory hallucinations: Denied. Delusional thoughts: Denied.  Tobacco: Denied. Alcohol: She reported consuming a glass of wine with dinner most nights. She denied a history of problematic alcohol abuse or dependence.  Recreational drugs: Denied. Caffeine: 1-2 cups of coffee in the morning.   Family History: Problem Relation Age of Onset  . Dementia Father        Possible at the end of her father's life (mid 81s)  . Colon cancer Neg Hx    This information was confirmed by Cathy Grimes.  Academic/Vocational History: Highest level of educational attainment: 13 years. She graduated high  school, as well as completed an additional year of college. She described herself as a good (A/B) student in academic settings. No relative weaknesses were identified. History of developmental delay: Denied. History of grade repetition: Denied. Enrollment in special education courses: Denied. History of LD/ADHD: Denied.  Employment: Retired. She previously worked in Scientist, research (life sciences) positions for the [employer]   Evaluation Results:   Behavioral Observations: Cathy Grimes was unaccompanied, arrived to her appointment on time, and was appropriately dressed and groomed. Observed gait and station were within normal limits. Gross motor functioning appeared intact upon informal observation and no abnormal movements (e.g., tremors) were noted. Her affect was generally relaxed and positive, but did range appropriately given the subject being discussed during the clinical interview or the task at hand during testing procedures. Spontaneous speech was fluent and word finding difficulties were not observed during the clinical interview or testing procedures. Thought processes were coherent, organized, and normal in content. During testing, sustained attention was appropriate. Task engagement was adequate and she persisted when challenged. She did exhibit increased fatigue as the evaluation progressed, potentially impacting performance across several tests (i.e., BVMT-R, D-KEFS Color Word Interference, Naming and Auditory Comprehension from the NAB). One task (D-KEFS 20 Questions) was not completed due to fatigue symptoms. Overall, Cathy Grimes was largely cooperative with the clinical interview and subsequent testing procedures.   Adequacy of Effort: The validity of neuropsychological testing is limited by the extent to which the individual being tested may be assumed to have exerted adequate effort during testing. Cathy Grimes expressed her intention to perform to the best of her abilities and exhibited  adequate task engagement and persistence. Scores across stand-alone and embedded performance validity measures were variable but largely within expectation. Her lone below expectation score was likely due to increasing symptoms of fatigue towards the end of the evaluation. As such, the results of the current evaluation are believed to be a valid representation of Cathy Grimes's current cognitive functioning.  Test Results: Cathy Grimes was generally oriented at the time of the current evaluation.  Intellectual abilities based upon educational and vocational attainment were  estimated to be in the average range. Premorbid abilities were estimated to be within the average range based upon a single-word reading test.   Processing speed was variable, ranging from below average to above average normative ranges, but generally within normal limits. Basic attention was above average. More complex attention (e.g., working memory) was average. Executive functioning was variable. Impairments were seen across response inhibition (potentially impacted by fatigue) and a task assessing visuomotor cognitive flexibility. Semantic fluency set shifting was within normal limits, while another task assessing hypothesis testing/problem solving was discontinued due to fatigue symptoms.  Assessed receptive language abilities were within normal limits. Likewise, Cathy Grimes did not exhibit any difficulties comprehending task instructions and answered all questions asked of her appropriately. Assessed expressive language (e.g., verbal fluency and confrontation naming) was largely within normal limits.     Assessed visuospatial/visuoconstructional abilities were generally within normal limits. However, a normative weakness was exhibited across a line orientation task.    Learning (i.e., encoding) of novel verbal and visual information was below average to average. Spontaneous delayed recall (i.e., retrieval) of previously learned  information was exceptionally low. Retention rates were 11% across a story learning task, 0% across a list learning task, 11% across a complex figure drawing task, and 25% across a shape learning task. With the exception of poor performance across a story learning recognition test, performance across all other recognition tasks were appropriate, suggesting evidence for information consolidation.   Results of emotional screening instruments suggested that recent symptoms of generalized anxiety were in the minimal range, while symptoms of depression were within normal limits. A screening instrument assessing recent sleep quality suggested the presence of minimal sleep dysfunction.  Tables of Scores:   Note: This summary of test scores accompanies the interpretive report and should not be considered in isolation without reference to the appropriate sections in the text. Descriptors are based on appropriate normative data and may be adjusted based on clinical judgment. The terms "impaired" and "within normal limits (WNL)" are used when a more specific level of functioning cannot be determined.       Effort Testing:   DESCRIPTOR       Dot Counting Test: --- --- Within Expectation  RBANS Effort Index: --- --- Within Expectation  WAIS-IV Reliable Digit Span: --- --- Within Expectation  BVMT-R Retention Percentage: --- --- Below Expectation       Orientation:      Raw Score Percentile   NAB Orientation, Form 1 28/29 --- ---       Cognitive Screening:          RBANS, Form A: Standard Score/ Scaled Score Percentile   Total Score 84 14 Below Average  Immediate Memory 100 50 Average    List Learning 9 37 Average    Story Memory 11 63 Average  Visuospatial/Constructional 81 10 Below Average    Figure Copy 12 75 Above Average    Line Orientation 6/20 <2 Exceptionally Low  Language 92 30 Average    Picture Naming 9/10 26-50 Average    Semantic Fluency 6 9 Below Average  Attention 88 21 Below  Average    Digit Span 10 50 Average    Coding 6 9 Below Average  Delayed Memory 82 12 Below Average    List Recall 0/10 <2 Exceptionally Low    List Recognition 19/20 26-50 Average    Story Recall 3 1 Exceptionally Low    Story Recognition 6/12 5-6 Well Below Average    Figure Recall  3 1 Exceptionally Low    Figure Recognition 7/8 69-83 Average       Intellectual Functioning:           Standard Score Percentile   Test of Premorbid Functioning: 102 55 Average       Memory:          Brief Visuospatial Memory Test (BVMT-R), Form 1: Raw Score (T Score) Percentile     Total Trials 1-3 8/36 (40) 16 Below Average    Delayed Recall 1/12 (34) 5 Well Below Average    Recognition Discrimination Index 4 (39) 14 Below Average      Recognition Hits 6/6 (56) 73 Average      False Positive Errors 2 (26) 1 Exceptionally Low  *From Duff (2016)          Attention/Executive Function:          Trail Making Test (TMT): Raw Score (T Score) Percentile     Part A 32 secs.,  0 errors (56) 73 Average    Part B Discontinued,  5 errors --- Impaired         Scaled Score Percentile   WAIS-IV Digit Span: 11 63 Average    Forward 12 75 Above Average    Backward 11 63 Average    Sequencing 10 50 Average       D-KEFS Color-Word Interference Test: Raw Score (Scaled Score) Percentile     Color Naming 35 secs. (10) 50 Average    Word Reading 20 secs. (13) 84 Above Average    Inhibition 98 secs. (9) 37 Average      Total Errors 7 errors (7) 16 Below Average    Inhibition/Switching 114 secs. (8) 25 Average      Total Errors 14 errors (1) <1 Exceptionally Low       D-KEFS Verbal Fluency Test: Raw Score (Scaled Score) Percentile     Letter Total Correct 34 (11) 63 Average    Category Total Correct 23 (7) 16 Below Average    Category Switching Total Correct 9 (8) 25 Average    Category Switching Accuracy 8 (9) 37 Average      Total Set Loss Errors 1 (11) 63 Average      Total Repetition Errors 5 (8)  25 Average       D-KEFS 20 Questions Test: Scaled Score Percentile     Total Weighted Achievement Score Discontinued (fatigue) --- ---    Initial Abstraction Score --- --- ---       Language:          Verbal Fluency Test: Raw Score (T Score) Percentile     Phonemic Fluency (FAS) 34 (46) 34 Average    Animal Fluency 16 (46) 34 Average       NAB Language Module, Form 1: T Score Percentile     Auditory Comprehension 58 79 Above Average    Naming 28/31 (46) 34 Average       Visuospatial/Visuoconstruction:      Raw Score Percentile   Clock Drawing: 8/10 --- Within Normal Limits       Mood and Personality:      Raw Score Percentile   Geriatric Depression Scale: 4 --- Within Normal Limits  Geriatric Anxiety Scale: 3 --- Minimal    Somatic 1 --- Minimal    Cognitive 2 --- Minimal    Affective 0 --- Minimal       Additional Questionnaires:      Raw Score Percentile   PROMIS  Sleep Disturbance Questionnaire: 22 --- None to Slight   Informed Consent and Coding/Compliance:   Cathy Grimes was provided with a verbal description of the nature and purpose of the present neuropsychological evaluation. Also reviewed were the foreseeable risks and/or discomforts and benefits of the procedure, limits of confidentiality, and mandatory reporting requirements of this provider. The patient was given the opportunity to ask questions and receive answers about the evaluation. Oral consent to participate was provided by the patient.   This evaluation was conducted by Newman Nickels, Ph.D., licensed clinical neuropsychologist. Cathy Grimes completed a comprehensive clinical interview with Dr. Milbert Coulter, billed as one unit [phone], and 125 minutes of cognitive testing and scoring, billed as one unit [phone] and three additional units [id number]. Psychometrist Wallace Keller, B.S., assisted Dr. Milbert Coulter with test administration and scoring procedures. As a separate and discrete service, Dr. Milbert Coulter spent a total of 180 minutes in  interpretation and report writing billed as one unit [phone] and two units 96133.

## 2019-06-06 NOTE — Progress Notes (Signed)
   Psychometrician Note   Cognitive testing was administered to Cathy Grimes by technician Wallace Keller, B.S. under the supervision of Dr. Newman Nickels, Ph.D., licensed psychologist. Ms. Newport did not appear overtly distressed by the testing session, per behavioral observation or via self-report to the technician. Rest breaks were offered.    In considering the patient's current level of functioning, level of presumed impairment, nature of symptoms, emotional and behavioral responses during the interview, level of literacy, and observed level of motivation/effort, a battery of tests was selected and communicated to the psychometrician.   Communication between the psychologist and technician was ongoing throughout the testing session and changes were made as deemed necessary based on patient performance on testing, technician observations and additional pertinent factors such as those listed above.   Cathy Grimes will return within approximately two weeks for an interactive feedback session with Dr. Milbert Coulter at which time her test performances, clinical impressions, and treatment recommendations will be reviewed in detail. The patient understands she can contact our office should she require our assistance before this time.  95 minutes were spent face-to-face with Ms. Cephus administering standardized tests. An additional 30 minutes were spent scoring by the technician. [CPT [id number], [id number]]  This note reflects time spent with the psychometrician and does not include test scores or any clinical interpretations made by Dr. Milbert Coulter. The full report will follow in a separate note.

## 2019-06-07 ENCOUNTER — Encounter: Payer: Self-pay | Admitting: Psychology

## 2019-06-07 DIAGNOSIS — E785 Hyperlipidemia, unspecified: Secondary | ICD-10-CM | POA: Insufficient documentation

## 2019-06-07 DIAGNOSIS — G3184 Mild cognitive impairment, so stated: Secondary | ICD-10-CM | POA: Insufficient documentation

## 2019-06-07 DIAGNOSIS — I10 Essential (primary) hypertension: Secondary | ICD-10-CM | POA: Insufficient documentation

## 2019-06-13 ENCOUNTER — Other Ambulatory Visit: Payer: Self-pay

## 2019-06-13 ENCOUNTER — Encounter: Payer: Self-pay | Admitting: Psychology

## 2019-06-13 ENCOUNTER — Ambulatory Visit (INDEPENDENT_AMBULATORY_CARE_PROVIDER_SITE_OTHER): Payer: Medicare Other | Admitting: Psychology

## 2019-06-13 DIAGNOSIS — G3184 Mild cognitive impairment, so stated: Secondary | ICD-10-CM

## 2019-06-13 NOTE — Progress Notes (Signed)
   Neuropsychology Feedback Session Cathy Grimes. [facility] Department of Neurology  Reason for Referral:   Cathy Grimes a 84 y.o. Caucasian female referred by Cathy Grimes, M.D.,to characterize hercurrent cognitive functioning and assist with diagnostic clarity and treatment planning in the context of subjective cognitive decline.  Feedback:   Cathy Grimes completed a comprehensive neuropsychological evaluation on 06/06/2019. Please refer to that encounter for the full report and recommendations. Briefly, results suggested a primary impairment surrounding some aspects of executive functioning (namely visuomotor cognitive flexibility, response inhibition, and information retrieval across memory measures). An additional isolated impairment was seen across a task assessing line orientations. The etiology for her weaknesses, especially those related to memory retrieval deficits is unclear. Neuroimaging suggesting small vessel ischemia can sometimes create difficulties in this area. While a head CT in 2016 suggested minimal small vessel disease, more recent neuroimaging was not found during record review. Cathy Grimes denied significant psychiatric distress, as well as symptoms of chronic pain, frequent headaches, or sleep disturbances which can also cause mild cognitive inefficiencies. Importantly, despite poor retrieval rates across memory measures, her consolidation scores were largely appropriate. This, accompanied by strong performances in other cognitive domains, is not consistent with the typical presentation of Alzheimer's disease. Cognitive and behavioral characteristics are also not strongly consistent with other forms of neurodegenerative illness at the present time.  Cathy Grimes was accompanied by her daughter during the current telephone call. Content of the current session focused on the results of her neuropsychological evaluation. Cathy Grimes and her daughter were given the  opportunity to ask questions and their questions were answered. They were encouraged to reach out should additional questions arise. A copy of her report was mailed at the conclusion of the visit.      21 minutes were spent conducting the current feedback session with Cathy Grimes, billed as one unit [phone]

## 2019-06-26 ENCOUNTER — Ambulatory Visit: Payer: Medicare Other | Attending: Internal Medicine

## 2019-06-26 DIAGNOSIS — Z23 Encounter for immunization: Secondary | ICD-10-CM | POA: Insufficient documentation

## 2019-06-26 NOTE — Progress Notes (Signed)
   Covid-19 Vaccination Clinic  Name:  ANGELI DEMILIO    MRN: [id number] DOB: 12/04/1935  06/26/2019  Ms. Olson was observed post Covid-19 immunization for 15 minutes without incident. She was provided with Vaccine Information Sheet and instruction to access the V-Safe system.   Ms. Chestnutt was instructed to call 911 with any severe reactions post vaccine: Marland Kitchen Difficulty breathing  . Swelling of face and throat  . A fast heartbeat  . A bad rash all over body  . Dizziness and weakness   Immunizations Administered    Name Date Dose VIS Date Route   Pfizer COVID-19 Vaccine 06/26/2019  9:34 AM 0.3 mL 04/06/2019 Intramuscular   Manufacturer: [employer], [employer]   Lot: [id number]   NDC: [address]-1

## 2019-09-12 ENCOUNTER — Encounter: Payer: Self-pay | Admitting: Neurology

## 2019-09-12 ENCOUNTER — Telehealth (INDEPENDENT_AMBULATORY_CARE_PROVIDER_SITE_OTHER): Payer: Medicare Other | Admitting: Neurology

## 2019-09-12 ENCOUNTER — Other Ambulatory Visit: Payer: Self-pay

## 2019-09-12 VITALS — Ht <= 58 in | Wt 119.0 lb

## 2019-09-12 DIAGNOSIS — G3184 Mild cognitive impairment, so stated: Secondary | ICD-10-CM

## 2019-09-12 DIAGNOSIS — M5442 Lumbago with sciatica, left side: Secondary | ICD-10-CM

## 2019-09-12 DIAGNOSIS — R413 Other amnesia: Secondary | ICD-10-CM

## 2019-09-12 MED ORDER — DONEPEZIL HCL 10 MG PO TABS: 10.0000 mg | ORAL_TABLET | Freq: Every day | ORAL | 3 refills | Status: DC
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
## 2019-09-18 ENCOUNTER — Other Ambulatory Visit: Payer: Self-pay

## 2019-09-18 ENCOUNTER — Telehealth: Payer: Self-pay | Admitting: Neurology

## 2019-09-18 DIAGNOSIS — R413 Other amnesia: Secondary | ICD-10-CM

## 2019-09-18 DIAGNOSIS — G3184 Mild cognitive impairment, so stated: Secondary | ICD-10-CM

## 2019-09-18 DIAGNOSIS — M5442 Lumbago with sciatica, left side: Secondary | ICD-10-CM

## 2019-09-18 NOTE — Telephone Encounter (Signed)
Pt daughter asking if we can also do MRI of spine,

## 2019-09-18 NOTE — Telephone Encounter (Signed)
Would start with MRI brain first. I got Dr. Marland Mcalpine note about her right hand, this is usually a nerve issue or arthritis if there is pain, rather than a spinal cord issue. If significantly weak, would go to ER. Thanks

## 2019-09-18 NOTE — Telephone Encounter (Signed)
Patient daughter has some questions about MRI scan please call

## 2019-09-18 NOTE — Telephone Encounter (Signed)
Spoke to pt daughter will start with MRI of the brain. Dr Karel Jarvis went over Dr. Marland Mcalpine note about her right hand, this is usually a nerve issue or arthritis if there is pain, rather than a spinal cord issue. Pt daughter advised that If significantly weak, would go to ER. Pt daughter verbalized understanding

## 2019-10-24 ENCOUNTER — Other Ambulatory Visit: Payer: Medicare Other

## 2019-11-01 ENCOUNTER — Other Ambulatory Visit: Payer: Self-pay

## 2019-11-01 ENCOUNTER — Ambulatory Visit
Admission: RE | Admit: 2019-11-01 | Discharge: 2019-11-01 | Disposition: A | Discharge status: Home / Self Care | Payer: Medicare Other | Source: Ambulatory Visit | Attending: Neurology | Admitting: Neurology

## 2019-11-01 DIAGNOSIS — G3184 Mild cognitive impairment, so stated: Secondary | ICD-10-CM

## 2019-11-02 ENCOUNTER — Telehealth: Payer: Self-pay

## 2019-11-02 NOTE — Telephone Encounter (Signed)
Pt called and informed that MRI brain did not show any evidence of tumor, stroke, or bleed. It showed age-related changes and mild hardening of the small blood vessels in the brain. Continue current medications, important to continue control of BP, cholesterol, sugar, which causes the hardening of small vessels. Pt verbalized understanding

## 2019-11-02 NOTE — Telephone Encounter (Signed)
-----   Message from Van Clines, MD sent at 11/02/2019 11:28 AM EDT ----- Pls let patient/daughter know the MRI brain did not show any evidence of tumor, stroke, or bleed. It showed age-related changes and mild hardening of the small blood vessels in the brain. Continue current medications, important to continue control of BP, cholesterol, sugar, which causes the hardening of small vessels. Thanks

## 2020-02-14 ENCOUNTER — Other Ambulatory Visit: Payer: Self-pay

## 2020-02-14 ENCOUNTER — Ambulatory Visit: Payer: Medicare Other | Attending: Family Medicine | Admitting: Physical Therapy

## 2020-02-14 DIAGNOSIS — M25531 Pain in right wrist: Secondary | ICD-10-CM | POA: Insufficient documentation

## 2020-02-14 DIAGNOSIS — R262 Difficulty in walking, not elsewhere classified: Secondary | ICD-10-CM

## 2020-02-14 DIAGNOSIS — M6281 Muscle weakness (generalized): Secondary | ICD-10-CM | POA: Diagnosis not present

## 2020-02-14 NOTE — Patient Instructions (Signed)
Access Code: [email or web address] URL: [email or web address] Date: 02/14/2020 Prepared by: Dwana Curd  Exercises Sit to Stand - 3 x daily - 7 x weekly - 1 sets - 10 reps Standing Hip Abduction with Counter Support - 3 x daily - 7 x weekly - 1 sets - 10 reps Standing March with Counter Support - 3 x daily - 7 x weekly - 1 sets - 10 reps Standing Single Leg Stance with Unilateral Counter Support - 3 x daily - 7 x weekly - 1 sets - 10 reps

## 2020-02-14 NOTE — Therapy (Addendum)
[facility] [address], [place], [place], [address] Phone: [phone]   Fax:  [phone]  Physical Therapy Evaluation  Patient Details  Name: Cathy Grimes MRN: [id number] Date of Birth: November 26, 1935 Referring Provider (PT): Maurice Small, MD   Encounter Date: 02/14/2020   PT End of Session - 02/14/20 1414    Visit Number 1    Date for PT Re-Evaluation 05/08/20    Authorization Type [employer] medicare    PT Start Time 0071    PT Stop Time 1314    PT Time Calculation (min) 43 min    Activity Tolerance Patient tolerated treatment well    Behavior During Therapy [facility] for tasks assessed/performed           Past Medical History:  Diagnosis Date  . Blood transfusion without reported diagnosis 1964   after miscarriage  . Hyperlipidemia   . Hypertension   . Mild neurocognitive disorder   . Sensorineural hearing loss (SNHL) of both ears 06/18/2016    Past Surgical History:  Procedure Laterality Date  . TONSILLECTOMY      There were no vitals filed for this visit.    Subjective Assessment - 02/14/20 1234    Subjective Pt's daughter is present and helps with history.  She reports [facility] and has noticed she is more slumped and not as active.  Pt was walking 20-30 minutes on the treadmill 3-4x/week.  Pt states she played pickle ball occasionally as well when at the gym.    Patient is accompained by: Family member   daughter   Limitations Walking;House hold activities    Patient Stated Goals be able to use the hand for cutting things and opening jars; get back to walking on the treadmill    Currently in Pain? Yes    Pain Score 1     Pain Location Wrist    Pain Orientation Right    Pain Descriptors / Indicators Sore;Aching    Pain Type Acute pain    Pain Radiating Towards all over the hand    Pain Onset More than a month ago    Pain Frequency Intermittent    Aggravating Factors   using the Rt hand    Pain Relieving Factors just not doing certain things    Effect of Pain on Daily Activities cutting meat and opening jars    Multiple Pain Sites No              [facility] PT Assessment - 02/15/20 0001      Assessment   Medical Diagnosis M25.531 (ICD-10-CM) - Pain in right wrist; R53.1 (ICD-10-CM) - Weakness    Referring Provider (PT) Maurice Small, MD    Onset Date/Surgical Date --   3 months for hand   Hand Dominance Right      Precautions   Precautions None      Restrictions   Weight Bearing Restrictions No      Balance Screen   Has the patient fallen in the past 6 months No    Has the patient had a decrease in activity level because of a fear of falling?  Yes    Is the patient reluctant to leave their home because of a fear of falling?  No      Home Ecologist residence    Living Arrangements Alone      Prior Function   Level of Independence Independent  Vocation Retired      Charity fundraiser Status Within Functional Limits for tasks assessed      Observation/Other Assessments   Focus on Therapeutic Outcomes (FOTO)  31% limited; goal 26%      Posture/Postural Control   Posture/Postural Control Postural limitations    Postural Limitations Rounded Shoulders      Strength   Overall Strength Comments performed opposition without pain    Right Wrist Flexion 3/5   +pain   Right Wrist Extension 4+/5    Right Hand Grip (lbs) 10    Left Hand Grip (lbs) 31      Palpation   Palpation comment no pain or tenderness to wrist extensors or flexors      Transfers   Five time sit to stand comments  13 seconds; no UE use      Ambulation/Gait   Gait Pattern Within Functional Limits      6 minute walk test results    Aerobic Endurance Distance Walked 1233      Standardized Balance Assessment   Standardized Balance Assessment Five Times Sit to Stand    Five times sit to stand comments  13 sec                       Objective measurements completed on examination: See above findings.       [facility] Adult PT Treatment/Exercise - 02/15/20 0001      Self-Care   Self-Care Other Self-Care Comments    Other Self-Care Comments  educated and performed intial HEP                  PT Education - 02/14/20 1415    Education Details Access Code: [email or web address]    Person(s) Educated Patient    Methods Explanation;Demonstration;Tactile cues;Verbal cues;Handout    Comprehension Verbalized understanding;Returned demonstration            PT Short Term Goals - 02/15/20 0916      PT SHORT TERM GOAL #1   Title Ind with initial HEP    Time 4    Period Weeks    Status New    Target Date 03/13/20      PT SHORT TERM GOAL #2   Title Pt will report 25% less hand/wrist pain    Time 4    Period Weeks    Status New    Target Date 03/13/20      PT SHORT TERM GOAL #3   Title 5 x sit to stand in 11 seconds or less for reduced risk of falls and improved LE strength    Baseline 13    Time 4    Period Weeks    Status New    Target Date 03/13/20             PT Long Term Goals - 02/15/20 0916      PT LONG TERM GOAL #1   Title pt will be ind with advanced HEP    Time 12    Period Weeks    Status New    Target Date 05/08/20      PT LONG TERM GOAL #2   Title Pt will be able to return to walking at the gym at least 2 days/week due to improved LE strength and balance of SLS for 5+ seconds bilaterally    Time 12    Period Weeks    Status New    Target Date 05/08/20  PT LONG TERM GOAL #3   Title Pt will be able to open bottles of water or soda due to improved grip strength of at least 30lb on the Rt hand    Baseline 10 lb    Time 12    Period Weeks    Status New    Target Date 05/08/20      PT LONG TERM GOAL #4   Title Pt will report 75% less wrist and hand pain    Time 12    Period Weeks    Status New    Target Date 05/08/20      PT LONG TERM GOAL #5   Title  Pt will demonstrate walking at least 1450 feet during 6 min walk test due to improved endurance    Baseline 1233 ft    Time 12    Period Weeks    Status New    Target Date 05/08/20                  Plan - 02/15/20 0906    Clinical Impression Statement Pt presents to clinic with Rt hand/wrist pain and weakness.  She is also experiencing deconditioning due to inability to do normal activities over the last 2 years, not going to gym to walk on the treadmill.  Pt has weakness as mentioned above and decreased balance based on SLS and standardized balance tests.  Pt demos 5xSTS in 13 sec and SLS for 1-3 seconds.  Pt performs 6 min walk test and walked 1233 ft.  Pt will benefit from skilled PT to address pain, strength and stability for her to improve functional activities and return to exercise for greater overall health.    Personal Factors and Comorbidities Age;Fitness;Past/Current Experience    Examination-Activity Limitations Locomotion Level    Examination-Participation Restrictions Meal Prep    Stability/Clinical Decision Making Evolving/Moderate complexity    Clinical Decision Making Low    Rehab Potential Excellent    PT Frequency 2x / week    PT Duration 12 weeks   12 weeks in case cannot get in for scheduling   PT Treatment/Interventions ADLs/Self Care Home Management;Cryotherapy;Electrical Stimulation;Iontophoresis 83m/ml Dexamethasone;Moist Heat;Therapeutic activities;Therapeutic exercise;Neuromuscular re-education;Patient/family education;Manual techniques;Dry needling;Passive range of motion;Taping;Gait training;Balance training    PT Next Visit Plan start with nustep warm up; wrist ROM and initial strengthening for grip and wrist flexors, moist heat on hand/wrist/pain management as needed, LE strength and balance review/adjust HEP    PT Home Exercise Plan Access Code: [email or web address]    Consulted and Agree with Plan of Care Patient           Patient will benefit from skilled  therapeutic intervention in order to improve the following deficits and impairments:  Abnormal gait, Difficulty walking, Pain, Decreased strength, Decreased cognition, Decreased activity tolerance, Decreased endurance  Visit Diagnosis: Muscle weakness (generalized)  Pain in right wrist  Difficulty in walking, not elsewhere classified     Problem List Patient Active Problem List   Diagnosis Date Noted  . Mild neurocognitive disorder   . Hyperlipidemia   . Hypertension   . Bilateral impacted cerumen 06/18/2016  . Sensorineural hearing loss (SNHL) of both ears 06/18/2016    JJule Ser PT 02/15/2020, 9:30 AM  [facility] [address] [place] [address]Phone: [phone]  Fax:  [phone] Name: BSHENISE WOLGAMOTTMRN: [id number] of Birth: [birth year] PHYSICAL THERAPY DISCHARGE SUMMARY  Visits from Start of Care:  1  Current functional level related to goals / functional outcomes: eval only   Remaining deficits: eval only   Education / Equipment:   Plan: Patient agrees to discharge.  Patient goals were not met. Patient is being discharged due to not returning since the last visit.  ?????     [employer], PT 03/10/20 2:32 PM

## 2020-02-15 NOTE — Addendum Note (Signed)
Addended by: Beatris Si on: 02/15/2020 09:32 AM   Modules accepted: Orders

## 2020-02-18 ENCOUNTER — Ambulatory Visit: Payer: Medicare Other | Admitting: Physical Therapy

## 2020-02-25 ENCOUNTER — Encounter: Payer: Medicare Other | Admitting: Physical Therapy

## 2020-03-04 ENCOUNTER — Ambulatory Visit: Payer: Medicare Other | Attending: Family Medicine | Admitting: Physical Therapy

## 2020-03-04 ENCOUNTER — Telehealth: Payer: Self-pay | Admitting: Physical Therapy

## 2020-03-04 NOTE — Telephone Encounter (Signed)
Patient did not show for appointment.  Patient was called and PT left message to please call us back.  Russella Dar, PT 03/04/20 2:21 PM

## 2020-03-10 ENCOUNTER — Ambulatory Visit: Payer: Medicare Other | Admitting: Physical Therapy

## 2020-03-18 ENCOUNTER — Ambulatory Visit
Admission: RE | Admit: 2020-03-18 | Discharge: 2020-03-18 | Disposition: A | Discharge status: Home / Self Care | Payer: Medicare Other | Source: Ambulatory Visit | Attending: Family Medicine | Admitting: Family Medicine

## 2020-03-18 ENCOUNTER — Other Ambulatory Visit: Payer: Self-pay | Admitting: Family Medicine

## 2020-03-18 DIAGNOSIS — R519 Headache, unspecified: Secondary | ICD-10-CM

## 2020-04-14 ENCOUNTER — Ambulatory Visit: Payer: Medicare Other | Admitting: Neurology

## 2020-06-13 ENCOUNTER — Ambulatory Visit: Payer: Medicare Other | Admitting: Neurology

## 2020-07-04 ENCOUNTER — Ambulatory Visit: Payer: Medicare Other | Admitting: Neurology

## 2020-07-04 ENCOUNTER — Encounter: Payer: Self-pay | Admitting: Neurology

## 2020-07-04 ENCOUNTER — Other Ambulatory Visit: Payer: Self-pay

## 2020-07-04 VITALS — BP 155/76 | HR 97 | Resp 18 | Ht 59.0 in | Wt 113.0 lb

## 2020-07-04 DIAGNOSIS — G3184 Mild cognitive impairment, so stated: Secondary | ICD-10-CM

## 2020-07-04 MED ORDER — DONEPEZIL HCL 10 MG PO TABS: 10.0000 mg | ORAL_TABLET | Freq: Every day | ORAL | 3 refills | Status: DC

## 2020-07-04 NOTE — Patient Instructions (Signed)
Looking great! Continue Donepezil 10mg  daily. Follow-up in 6-8 months, call for any changes.   FALL PRECAUTIONS: Be cautious when walking. Scan the area for obstacles that may increase the risk of trips and falls. When getting up in the mornings, sit up at the edge of the bed for a few minutes before getting out of bed. Consider elevating the bed at the head end to avoid drop of blood pressure when getting up. Walk always in a well-lit room (use night lights in the walls). Avoid area rugs or power cords from appliances in the middle of the walkways. Use a walker or a cane if necessary and consider physical therapy for balance exercise. Get your eyesight checked regularly.  FINANCIAL OVERSIGHT: Supervision, especially oversight when making financial decisions or transactions is also recommended if decline is noted  HOME SAFETY: Consider the safety of the kitchen when operating appliances like stoves, microwave oven, and blender. Consider having supervision and share cooking responsibilities until no longer able to participate in those. Accidents with firearms and other hazards in the house should be identified and addressed as well.  DRIVING: Regarding driving, in patients with progressive memory problems, driving will be impaired. We advise to have someone else do the driving if trouble finding directions or if minor accidents are reported. Independent driving assessment is available to determine safety of driving.  ABILITY TO BE LEFT ALONE: If patient is unable to contact 911 operator, consider using LifeLine, or when the need is there, arrange for someone to stay with patients. Smoking is a fire hazard, consider supervision or cessation. Risk of wandering should be assessed by caregiver and if detected at any point, supervision and safe proof recommendations should be instituted.  MEDICATION SUPERVISION: Inability to self-administer medication needs to be constantly addressed. Implement a mechanism to  ensure safe administration of the medications.  RECOMMENDATIONS FOR ALL PATIENTS WITH MEMORY PROBLEMS: 1. Continue to exercise (Recommend 30 minutes of walking everyday, or 3 hours every week) 2. Increase social interactions - continue going to [place] and enjoy social gatherings with friends and family 3. Eat healthy, avoid fried foods and eat more fruits and vegetables 4. Maintain adequate blood pressure, blood sugar, and blood cholesterol level. Reducing the risk of stroke and cardiovascular disease also helps promoting better memory. 5. Avoid stressful situations. Live a simple life and avoid aggravations. Organize your time and prepare for the next day in anticipation. 6. Sleep well, avoid any interruptions of sleep and avoid any distractions in the bedroom that may interfere with adequate sleep quality 7. Avoid sugar, avoid sweets as there is a strong link between excessive sugar intake, diabetes, and cognitive impairment We discussed the Mediterranean diet, which has been shown to help patients reduce the risk of progressive memory disorders and reduces cardiovascular risk. This includes eating fish, eat fruits and green leafy vegetables, nuts like almonds and hazelnuts, walnuts, and also use olive oil. Avoid fast foods and fried foods as much as possible. Avoid sweets and sugar as sugar use has been linked to worsening of memory function.  There is always a concern of gradual progression of memory problems. If this is the case, then we may need to adjust level of care according to patient needs. Support, both to the patient and caregiver, should then be put into place.

## 2020-07-04 NOTE — Progress Notes (Signed)
NEUROLOGY FOLLOW UP OFFICE NOTE  Cathy Grimes [id number] [birth year]  HISTORY OF PRESENT ILLNESS: I had the pleasure of seeing Cathy Grimes in follow-up in the neurology clinic on 07/04/2020.  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 She is again accompanied by her daughter who helps supplement the history today.  Records and images were personally reviewed where available.  I personally reviewed MRI brain without contrast done 10/2019 which did not show any acute changes. There was mild to moderate diffuse atrophy and chronic microvascular disease. She is on Donepezil 10mg  daily without side effects. Since her last visit, she and her daughter report that memory is not any worse, about the same. She denies missing medications. There were one or two snafus with her bills but she does well for the most part. She denies getting lost driving. She manages her own meals. She is independent with dressing and bathing, no hygiene concerns. She was having frequent headaches a few months ago, head CT no acute changes. She reports that the headaches have disappeared. Her daughter feels it may have been related to hydration, she is doing better with this. She denies any further hand weakness. No dizziness, focal numbness/tingling/weakness, no falls. Sleep and mood are good, no paranoia or hallucinations.   History on Initial Assessment 09/13/2017: This is a pleasant 85 year old right-handed woman with a history of hypertension, hyperlipidemia, presenting for evaluation of memory loss. She notes her memory at times does bother her. Her son has been staying with her for a few months and would remind her that they had already seen the same show a few weeks ago. Her children would tell her she repeats herself. She lives alone and denies getting lost driving, no missed bills or medications. No word-finding difficulties. She denies misplacing things frequently or leaving  the stove on. She is active and exercises regularly. No report of personality changes, she denies any hallucinations. She is independent with all ADLs. MMSE at PCP office in March 2019 was 23/30.  She has noticed her sense of smell is not as keen as others, otherwise she denies any headaches, dizziness, diplopia, dysarthria/dysphagia, neck/back pain, focal numbness/tingling/weakness, bowel/bladder dysfunction, or tremors. There is no family history of dementia. She denies any history of significant head injuries. She drinks alcohol socially.   Diagnostic Data: Head CT done in 11/2015 showed diffusely enlarged ventricles and subarachnoid spaces, mild atrophy, minimal chronic microvascular disease.  Neuropsychological testing in February 2021 suggestive of primary impairment surrounding some aspects of executive functioning and information retrieval across memory measures, diagnosis of Mild Neurocognitive Disorder, etiology possibly vascular. MRI brain in 10/2019 no acute changes, there was mild to moderate diffuse atrophy and chronic microvascular disease.     PAST MEDICAL HISTORY: Past Medical History:  Diagnosis Date  . Blood transfusion without reported diagnosis 1964   after miscarriage  . Hyperlipidemia   . Hypertension   . Mild neurocognitive disorder   . Sensorineural hearing loss (SNHL) of both ears 06/18/2016    MEDICATIONS: Current Outpatient Medications on File Prior to Visit  Medication Sig Dispense Refill  . aspirin EC 81 MG tablet Take 81 mg by mouth daily.    06/20/2016 atorvastatin (LIPITOR) 10 MG tablet Take 5 mg by mouth at bedtime.    . Atorvastatin Calcium (LIPITOR PO) Take by mouth at bedtime.    . Calcium Carbonate-Vitamin D (CALCIUM + D PO) Take by mouth 2 (two) times daily.    Marland Kitchen  donepezil (ARICEPT) 10 MG tablet Take 1 tablet (10 mg total) by mouth at bedtime. 90 tablet 3  . meloxicam (MOBIC) 15 MG tablet Take 15 mg by mouth daily.    . Multiple Vitamin (MULTIVITAMIN) tablet  Take 1 tablet by mouth daily.    . Omega-3 Fatty Acids (FISH OIL PO) Take by mouth 3 (three) times daily.    Marland Kitchen telmisartan (MICARDIS) 40 MG tablet Take 40 mg by mouth daily.     No current facility-administered medications on file prior to visit.    ALLERGIES: Allergies  Allergen Reactions  . Lisinopril Cough    FAMILY HISTORY: Family History  Problem Relation Age of Onset  . Dementia Father        Possible at the end of her father's life (mid 69s)  . Colon cancer Neg Hx     SOCIAL HISTORY: Social History   Socioeconomic History  . Marital status: Married    Spouse name: Not on file  . Number of children: Not on file  . Years of education: 67  . Highest education level: Some college, no degree  Occupational History  . Occupation: Retired  Tobacco Use  . Smoking status: Never Smoker  . Smokeless tobacco: Never Used  Vaping Use  . Vaping Use: Never used  Substance and Sexual Activity  . Alcohol use: Yes    Alcohol/week: 7.0 standard drinks    Types: 7 Glasses of wine per week    Comment: glass of wine with dinner  . Drug use: No  . Sexual activity: Not on file  Other Topics Concern  . 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 No aphasia or dysarthria. Fund of knowledge is appropriate.  Recent and remote memory are intact.  Attention  and concentration are normal.  MMSE 28/30 MMSE - Mini Mental State Exam 07/04/2020  Orientation to time 4  Orientation to Place 5  Registration 3  Attention/ Calculation 5  Recall 2  Language- name 2 objects 2  Language- repeat 1  Language- follow 3 step command 3  Language- read & follow direction 1  Write a sentence 1  Copy design 1  Total score 28   Cranial nerves: Pupils equal, round. Extraocular movements intact with no nystagmus. Visual fields full.  No facial asymmetry.  Motor: Bulk and tone normal, muscle strength 5/5 throughout with no pronator drift.   Finger to nose testing intact.  Gait narrow-based and steady, able to tandem walk adequately.    IMPRESSION: This is a pleasant 85 yo RH woman with a history of hypertension, hyperlipidemia, who presented in 2019 for memory loss. Neuropsychological testing in February 2021 indicated Mild Neurocognitive disorder, etiology possibly vascular. MRI brain showed mild to moderate diffuse atrophy and chronic microvascular disease. She is  doing well on Donepezil 10mg  daily. MMSE today normal 28/30. Continue to monitor medication compliance, we discussed the importance of control of vascular risk factors, physical exercise, and brain stimulation exercises for brain health. Monitor driving. There was prior concern for headaches and right hand weakness, which have both resolved. Follow-up in 6-8 months, they know to call for any changes.    Thank you for allowing me to participate in her care.  Please do not hesitate to call for any questions or concerns.   , M.D.   CC: Dr. Patrcia Dolly

## 2020-08-06 DIAGNOSIS — I10 Essential (primary) hypertension: Secondary | ICD-10-CM | POA: Diagnosis not present

## 2020-08-06 DIAGNOSIS — E785 Hyperlipidemia, unspecified: Secondary | ICD-10-CM | POA: Diagnosis not present

## 2020-08-06 DIAGNOSIS — Z0000 Encounter for general adult medical examination without abnormal findings: Secondary | ICD-10-CM | POA: Diagnosis not present

## 2020-08-06 DIAGNOSIS — H3530 Unspecified macular degeneration: Secondary | ICD-10-CM | POA: Diagnosis not present

## 2021-02-04 DIAGNOSIS — H35369 Drusen (degenerative) of macula, unspecified eye: Secondary | ICD-10-CM | POA: Diagnosis not present

## 2021-03-17 ENCOUNTER — Other Ambulatory Visit: Payer: Self-pay

## 2021-03-17 ENCOUNTER — Ambulatory Visit: Payer: Medicare Other | Admitting: Neurology

## 2021-03-17 ENCOUNTER — Encounter: Payer: Self-pay | Admitting: Neurology

## 2021-03-17 VITALS — BP 128/81 | HR 75 | Ht 60.0 in | Wt 119.4 lb

## 2021-03-17 DIAGNOSIS — G3184 Mild cognitive impairment, so stated: Secondary | ICD-10-CM

## 2021-03-17 MED ORDER — DONEPEZIL HCL 10 MG PO TABS: 10.0000 mg | ORAL_TABLET | Freq: Every day | ORAL | 3 refills | Status: DC

## 2021-03-17 NOTE — Patient Instructions (Signed)
Good to see you. Continue Donepezil 10mg daily. Follow-up in 6 months, call for any changes   FALL PRECAUTIONS: Be cautious when walking. Scan the area for obstacles that may increase the risk of trips and falls. When getting up in the mornings, sit up at the edge of the bed for a few minutes before getting out of bed. Consider elevating the bed at the head end to avoid drop of blood pressure when getting up. Walk always in a well-lit room (use night lights in the walls). Avoid area rugs or power cords from appliances in the middle of the walkways. Use a walker or a cane if necessary and consider physical therapy for balance exercise. Get your eyesight checked regularly.  FINANCIAL OVERSIGHT: Supervision, especially oversight when making financial decisions or transactions is also recommended.  HOME SAFETY: Consider the safety of the kitchen when operating appliances like stoves, microwave oven, and blender. Consider having supervision and share cooking responsibilities until no longer able to participate in those. Accidents with firearms and other hazards in the house should be identified and addressed as well.  DRIVING: Regarding driving, in patients with progressive memory problems, driving will be impaired. We advise to have someone else do the driving if trouble finding directions or if minor accidents are reported. Independent driving assessment is available to determine safety of driving.  ABILITY TO BE LEFT ALONE: If patient is unable to contact 911 operator, consider using LifeLine, or when the need is there, arrange for someone to stay with patients. Smoking is a fire hazard, consider supervision or cessation. Risk of wandering should be assessed by caregiver and if detected at any point, supervision and safe proof recommendations should be instituted.  MEDICATION SUPERVISION: Inability to self-administer medication needs to be constantly addressed. Implement a mechanism to ensure safe  administration of the medications.  RECOMMENDATIONS FOR ALL PATIENTS WITH MEMORY PROBLEMS: 1. Continue to exercise (Recommend 30 minutes of walking everyday, or 3 hours every week) 2. Increase social interactions - continue going to [employer] and enjoy social gatherings with friends and family 3. Eat healthy, avoid fried foods and eat more fruits and vegetables 4. Maintain adequate blood pressure, blood sugar, and blood cholesterol level. Reducing the risk of stroke and cardiovascular disease also helps promoting better memory. 5. Avoid stressful situations. Live a simple life and avoid aggravations. Organize your time and prepare for the next day in anticipation. 6. Sleep well, avoid any interruptions of sleep and avoid any distractions in the bedroom that may interfere with adequate sleep quality 7. Avoid sugar, avoid sweets as there is a strong link between excessive sugar intake, diabetes, and cognitive impairment We discussed the Mediterranean diet, which has been shown to help patients reduce the risk of progressive memory disorders and reduces cardiovascular risk. This includes eating fish, eat fruits and green leafy vegetables, nuts like almonds and hazelnuts, walnuts, and also use olive oil. Avoid fast foods and fried foods as much as possible. Avoid sweets and sugar as sugar use has been linked to worsening of memory function.  There is always a concern of gradual progression of memory problems. If this is the case, then we may need to adjust level of care according to patient needs. Support, both to the patient and caregiver, should then be put into place.  

## 2021-03-17 NOTE — Progress Notes (Signed)
NEUROLOGY FOLLOW UP OFFICE NOTE  Cathy Grimes [id number] [birth year]  HISTORY OF PRESENT ILLNESS: I had the pleasure of seeing Cathy Grimes in follow-up in the neurology clinic on 03/17/2021.  The patient was last seen 8 months ago for Mild Neurocognitive disorder, possibly vascular. She is again accompanied by her daughter who helps supplement the history today.  Records and images were personally reviewed where available.  MMSE 28/30 in 06/2020. She is on Donepezil 10mg  daily without side effects. Since her last visit, she feels things have been good. Her daughter is concerned about her medications and finances. When she visited her sister, there was concern about medication compliance, her daughter has ordered a pill dispenser from . She denies missing bill payments, her daughter reminds her there have been some late payments and she double paid in September. They are trying to put more bills on draft. She denies getting lost driving. She denies leaving the stove on. She continues to live alone, her daughter lives 4 miles away. She is independent with dressing and bathing, no hygiene concerns. She denies any headaches, dizziness, vision changes, no falls. Her left hand sometimes cramps up. Sleep is very good. No personality changes, paranoia, or hallucinations.    History on Initial Assessment 09/13/2017: This is a pleasant 85 year old right-handed woman with a history of hypertension, hyperlipidemia, presenting for evaluation of memory loss. She notes her memory at times does bother her. Her son has been staying with her for a few months and would remind her that they had already seen the same show a few weeks ago. Her children would tell her she repeats herself. She lives alone and denies getting lost driving, no missed bills or medications. No word-finding difficulties. She denies misplacing things frequently or leaving the stove on. She is active and exercises regularly. No report of  personality changes, she denies any hallucinations. She is independent with all ADLs. MMSE at PCP office in March 2019 was 23/30.  She has noticed her sense of smell is not as keen as others, otherwise she denies any headaches, dizziness, diplopia, dysarthria/dysphagia, neck/back pain, focal numbness/tingling/weakness, bowel/bladder dysfunction, or tremors. There is no family history of dementia. She denies any history of significant head injuries. She drinks alcohol socially.   Diagnostic Data: Head CT done in 11/2015 showed diffusely enlarged ventricles and subarachnoid spaces, mild atrophy, minimal chronic microvascular disease.  Neuropsychological testing in February 2021 suggestive of primary impairment surrounding some aspects of executive functioning and information retrieval across memory measures, diagnosis of Mild Neurocognitive Disorder, etiology possibly vascular. MRI brain in 10/2019 no acute changes, there was mild to moderate diffuse atrophy and chronic microvascular disease.    PAST MEDICAL HISTORY: Past Medical History:  Diagnosis Date   Blood transfusion without reported diagnosis 1964   after miscarriage   Hyperlipidemia    Hypertension    Mild neurocognitive disorder    Sensorineural hearing loss (SNHL) of both ears 06/18/2016    MEDICATIONS: Current Outpatient Medications on File Prior to Visit  Medication Sig Dispense Refill   aspirin EC 81 MG tablet Take 81 mg by mouth daily.     atorvastatin (LIPITOR) 10 MG tablet Take 5 mg by mouth at bedtime.     Calcium Carbonate-Vitamin D (CALCIUM + D PO) Take by mouth 2 (two) times daily.     donepezil (ARICEPT) 10 MG tablet Take 1 tablet (10 mg total) by mouth at bedtime. 90 tablet 3   meloxicam (MOBIC) 15 MG tablet  Take 15 mg by mouth daily.     Multiple Vitamin (MULTIVITAMIN) tablet Take 1 tablet by mouth daily.     telmisartan (MICARDIS) 40 MG tablet Take 40 mg by mouth daily.     Atorvastatin Calcium (LIPITOR PO) Take by  mouth at bedtime.     No current facility-administered medications on file prior to visit.    ALLERGIES: Allergies  Allergen Reactions   Lisinopril Cough    FAMILY HISTORY: Family History  Problem Relation Age of Onset   Dementia Father        Possible at the end of her father's life (mid 64s)   Colon cancer Neg Hx     SOCIAL HISTORY: Social History   Socioeconomic History   Marital status: Married    Spouse name: Not on file   Number of children: Not on file   Years of education: 13   Highest education level: Some college, no degree  Occupational History   Occupation: Retired  Tobacco Use   Smoking status: Never   Smokeless tobacco: Never  Vaping Use   Vaping Use: Never used  Substance and Sexual Activity   Alcohol use: Yes    Alcohol/week: 7.0 standard drinks    Types: 7 Glasses of wine per week    Comment: glass of wine with dinner   Drug use: No   Sexual activity: Not on file  Other Topics Concern   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 No aphasia or dysarthria. Fund of knowledge is appropriate.  Recent and remote memory are intact, 3/3 delayed recall.  Attention and concentration are normal, 5/5 WORLD backwards. Cranial nerves: Pupils equal, round. Extraocular movements intact with no nystagmus. Visual fields full.  No facial asymmetry.  Some hearing loss, she does not like wearing hearing aids.  Motor: Bulk and tone normal, muscle strength 5/5 throughout with no pronator drift.   Finger to nose testing intact.  Gait narrow-based and steady, no ataxia   IMPRESSION: This is a pleasant 85 yo RH woman with a history of hypertension, hyperlipidemia, who presented in 2019 for memory loss. Neuropsychological testing in February 2021 indicated Mild Neurocognitive disorder, etiology possibly vascular. MRI brain showed mild to moderate diffuse atrophy and chronic microvascular disease. Her daughter reports more concern about complex tasks and will closely monitor. Continue Donepezil 10mg  daily. Continue monitoring driving. We again discussed the importance of control of vascular risk factors, physical exercise, brain stimulation exercises, and MIND diet for overall brain health. Follow-up with [facility] in 6 months, they know to call for any changes.   Thank you for allowing me to participate in her care.  Please do not hesitate to call for any questions or concerns.    Marlowe Kays, M.D.   CC: Dr. Patrcia Dolly

## 2021-04-22 DIAGNOSIS — I10 Essential (primary) hypertension: Secondary | ICD-10-CM | POA: Diagnosis not present

## 2021-04-22 DIAGNOSIS — R112 Nausea with vomiting, unspecified: Secondary | ICD-10-CM | POA: Diagnosis not present

## 2021-04-22 DIAGNOSIS — I6789 Other cerebrovascular disease: Secondary | ICD-10-CM | POA: Diagnosis not present

## 2021-04-22 DIAGNOSIS — E785 Hyperlipidemia, unspecified: Secondary | ICD-10-CM | POA: Diagnosis not present

## 2021-05-12 ENCOUNTER — Other Ambulatory Visit: Payer: Self-pay | Admitting: Family Medicine

## 2021-05-12 DIAGNOSIS — E2839 Other primary ovarian failure: Secondary | ICD-10-CM

## 2021-09-25 ENCOUNTER — Ambulatory Visit: Payer: Medicare Other | Admitting: Neurology

## 2021-09-25 ENCOUNTER — Encounter: Payer: Self-pay | Admitting: Neurology

## 2021-09-25 VITALS — BP 156/84 | HR 82 | Ht 59.0 in | Wt 118.0 lb

## 2021-09-25 DIAGNOSIS — G3184 Mild cognitive impairment, so stated: Secondary | ICD-10-CM

## 2021-09-25 MED ORDER — DONEPEZIL HCL 10 MG PO TABS: 10.0000 mg | ORAL_TABLET | Freq: Every day | ORAL | 3 refills | Status: DC

## 2021-09-25 NOTE — Progress Notes (Signed)
NEUROLOGY FOLLOW UP OFFICE NOTE  Cathy Grimes [id number] 24-Nov-1935  HISTORY OF PRESENT ILLNESS: I had the pleasure of seeing Cathy Grimes in follow-up in the neurology clinic on 09/25/2021. The patient was last seen 7 months ago for memory loss. She is again accompanied by her daughter who helps supplement the history today. Neuropsychological evaluation in 2021 indicated Mild Neurocognitive Disorder, possibly vascular. She is on Donepezil 10mg  daily without side effects. Since her last visit, they report she has been overall stable. She lives alone. She continues to drive and denies getting lost. Her daughter fills her pillbox and she remembers to take them regularly, rarely missing a dose. There have been some months with issues in managing finances, either double paying or forgetting. Her children check behind her. She denies leaving the stove on. She is independent with dressing and bathing, no hygiene concerns. No behavioral changes, no paranoia or hallucinations. Mood is good. She denies any headaches, dizziness, focal numbness/tingling/weakness, bowel/bladder dysfunction except for rare constipation. She gets 9 hours of sleep. She stopped exercising during Covid, her daughter reported she mostly watches TV. She was visiting her son recently when she got up and fell, hitting her lower lip.   History on Initial Assessment 09/13/2017: This is a pleasant 86 year old right-handed woman with a history of hypertension, hyperlipidemia, presenting for evaluation of memory loss. She notes her memory at times does bother her. Her son has been staying with her for a few months and would remind her that they had already seen the same show a few weeks ago. Her children would tell her she repeats herself. She lives alone and denies getting lost driving, no missed bills or medications. No word-finding difficulties. She denies misplacing things frequently or leaving the stove on. She is active and exercises  regularly. No report of personality changes, she denies any hallucinations. She is independent with all ADLs. MMSE at PCP office in March 2019 was 23/30.  She has noticed her sense of smell is not as keen as others, otherwise she denies any headaches, dizziness, diplopia, dysarthria/dysphagia, neck/back pain, focal numbness/tingling/weakness, bowel/bladder dysfunction, or tremors. There is no family history of dementia. She denies any history of significant head injuries. She drinks alcohol socially.   Diagnostic Data: Head CT done in 11/2015 showed diffusely enlarged ventricles and subarachnoid spaces, mild atrophy, minimal chronic microvascular disease.   Neuropsychological testing in February 2021 suggestive of primary impairment surrounding some aspects of executive functioning and information retrieval across memory measures, diagnosis of Mild Neurocognitive Disorder, etiology possibly vascular.  MRI brain in 10/2019 no acute changes, there was mild to moderate diffuse atrophy and chronic microvascular disease.    PAST MEDICAL HISTORY: Past Medical History:  Diagnosis Date   Blood transfusion without reported diagnosis 1964   after miscarriage   Hyperlipidemia    Hypertension    Mild neurocognitive disorder    Sensorineural hearing loss (SNHL) of both ears 06/18/2016    MEDICATIONS: Current Outpatient Medications on File Prior to Visit  Medication Sig Dispense Refill   aspirin EC 81 MG tablet Take 81 mg by mouth daily.     atorvastatin (LIPITOR) 10 MG tablet Take 5 mg by mouth at bedtime.     Atorvastatin Calcium (LIPITOR PO) Take by mouth at bedtime.     Calcium Carbonate-Vitamin D (CALCIUM + D PO) Take by mouth 2 (two) times daily.     donepezil (ARICEPT) 10 MG tablet Take 1 tablet (10 mg total) by mouth at  bedtime. 90 tablet 3   meloxicam (MOBIC) 15 MG tablet Take 15 mg by mouth daily.     Multiple Vitamin (MULTIVITAMIN) tablet Take 1 tablet by mouth daily.     telmisartan  (MICARDIS) 40 MG tablet Take 40 mg by mouth daily.     No current facility-administered medications on file prior to visit.    ALLERGIES: Allergies  Allergen Reactions   Lisinopril Cough    FAMILY HISTORY: Family History  Problem Relation Age of Onset   Dementia Father        Possible at the end of her father's life (mid 16s)   Colon cancer Neg Hx     SOCIAL HISTORY: Social History   Socioeconomic History   Marital status: Married    Spouse name: Not on file   Number of children: Not on file   Years of education: 13   Highest education level: Some college, no degree  Occupational History   Occupation: Retired  Tobacco Use   Smoking status: Never   Smokeless tobacco: Never  Vaping Use   Vaping Use: Never used  Substance and Sexual Activity   Alcohol use: Yes    Alcohol/week: 7.0 standard drinks    Types: 7 Glasses of wine per week    Comment: glass of wine with dinner   Drug use: No   Sexual activity: Not on file  Other Topics Concern   Not on file  Social History Narrative   Pt lives alone in 1 story home   Has 3 adult children son lives 3 home down and daughter lives 4 miles away   Some college education   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 No aphasia or dysarthria. Fund of knowledge is appropriate.  Recent and remote memory are intact.  Attention and concentration are normal.  MMSE 28/30    09/25/2021    3:00 PM 07/04/2020    3:00 PM  MMSE - Mini  Mental State Exam  Orientation to time 4 4  Orientation to Place 5 5  Registration 3 3  Attention/ Calculation 5 5  Recall 2 2  Language- name 2 objects 2 2  Language- repeat 1 1  Language- follow 3 step command 3 3  Language- read & follow direction 1 1  Write a sentence 1 1  Copy design 1 1  Total score 28 28   Cranial nerves: Pupils equal, round. Extraocular movements intact with no nystagmus. Visual fields full.  No facial asymmetry.  Motor: Bulk and tone normal, muscle strength 5/5 throughout with no pronator drift.   Finger to nose testing intact.  Gait narrow-based and steady, no ataxia.    IMPRESSION: This is a pleasant 86yo RH woman with a history of hypertension, hyperlipidemia, who presented in 2019 for memory loss. Neuropsychological testing in February 2021 indicated Mild Neurocognitive disorder, etiology possibly vascular. MRI brain showed mild to moderate diffuse atrophy and chronic microvascular disease. Symptoms overall stable, she  has some difficulties with complex tasks, MMSE today stable 28/30. Continue Donepezil 10mg  daily. We again discussed the importance of control of vascular risk factors, physical exercise, brain stimulation exercises, and MIND diet for overall brain health. Continue to monitor driving. Follow-up with [facility] PA Marlowe KaysSara Wertman in 6 months, call for any changes.   Thank you for allowing me to participate in her care.  Please do not hesitate to call for any questions or concerns.    Patrcia DollyKaren Tamiki Kuba, M.D.   CC: Dr. Paulino RilyWolters

## 2021-09-25 NOTE — Patient Instructions (Signed)
Good to see you doing well. Continue Donepezil 10mg  daily. Follow-up in 6 months, call for any changes.    FALL PRECAUTIONS: Be cautious when walking. Scan the area for obstacles that may increase the risk of trips and falls. When getting up in the mornings, sit up at the edge of the bed for a few minutes before getting out of bed. Consider elevating the bed at the head end to avoid drop of blood pressure when getting up. Walk always in a well-lit room (use night lights in the walls). Avoid area rugs or power cords from appliances in the middle of the walkways. Use a walker or a cane if necessary and consider physical therapy for balance exercise. Get your eyesight checked regularly.  FINANCIAL OVERSIGHT: Supervision, especially oversight when making financial decisions or transactions is also recommended.  HOME SAFETY: Consider the safety of the kitchen when operating appliances like stoves, microwave oven, and blender. Consider having supervision and share cooking responsibilities until no longer able to participate in those. Accidents with firearms and other hazards in the house should be identified and addressed as well.  DRIVING: Regarding driving, in patients with progressive memory problems, driving will be impaired. We advise to have someone else do the driving if trouble finding directions or if minor accidents are reported. Independent driving assessment is available to determine safety of driving.  ABILITY TO BE LEFT ALONE: If patient is unable to contact 911 operator, consider using LifeLine, or when the need is there, arrange for someone to stay with patients. Smoking is a fire hazard, consider supervision or cessation. Risk of wandering should be assessed by caregiver and if detected at any point, supervision and safe proof recommendations should be instituted.  MEDICATION SUPERVISION: Inability to self-administer medication needs to be constantly addressed. Implement a mechanism to ensure  safe administration of the medications.  RECOMMENDATIONS FOR ALL PATIENTS WITH MEMORY PROBLEMS: 1. Continue to exercise (Recommend 30 minutes of walking everyday, or 3 hours every week) 2. Increase social interactions - continue going to [place] and enjoy social gatherings with friends and family 3. Eat healthy, avoid fried foods and eat more fruits and vegetables 4. Maintain adequate blood pressure, blood sugar, and blood cholesterol level. Reducing the risk of stroke and cardiovascular disease also helps promoting better memory. 5. Avoid stressful situations. Live a simple life and avoid aggravations. Organize your time and prepare for the next day in anticipation. 6. Sleep well, avoid any interruptions of sleep and avoid any distractions in the bedroom that may interfere with adequate sleep quality 7. Avoid sugar, avoid sweets as there is a strong link between excessive sugar intake, diabetes, and cognitive impairment We discussed the Mediterranean diet, which has been shown to help patients reduce the risk of progressive memory disorders and reduces cardiovascular risk. This includes eating fish, eat fruits and green leafy vegetables, nuts like almonds and hazelnuts, walnuts, and also use olive oil. Avoid fast foods and fried foods as much as possible. Avoid sweets and sugar as sugar use has been linked to worsening of memory function.  There is always a concern of gradual progression of memory problems. If this is the case, then we may need to adjust level of care according to patient needs. Support, both to the patient and caregiver, should then be put into place.

## 2021-10-06 DIAGNOSIS — Z0000 Encounter for general adult medical examination without abnormal findings: Secondary | ICD-10-CM | POA: Diagnosis not present

## 2021-10-06 DIAGNOSIS — E785 Hyperlipidemia, unspecified: Secondary | ICD-10-CM | POA: Diagnosis not present

## 2021-10-06 DIAGNOSIS — M8580 Other specified disorders of bone density and structure, unspecified site: Secondary | ICD-10-CM | POA: Diagnosis not present

## 2021-10-06 DIAGNOSIS — I10 Essential (primary) hypertension: Secondary | ICD-10-CM | POA: Diagnosis not present

## 2021-10-06 DIAGNOSIS — R413 Other amnesia: Secondary | ICD-10-CM | POA: Diagnosis not present

## 2021-11-16 ENCOUNTER — Inpatient Hospital Stay: Admission: RE | Admit: 2021-11-16 | Payer: Medicare Other | Source: Ambulatory Visit

## 2021-11-18 ENCOUNTER — Ambulatory Visit
Admission: RE | Admit: 2021-11-18 | Discharge: 2021-11-18 | Disposition: A | Discharge status: Home / Self Care | Payer: Medicare Other | Source: Ambulatory Visit | Attending: Family Medicine | Admitting: Family Medicine

## 2021-11-18 DIAGNOSIS — M85851 Other specified disorders of bone density and structure, right thigh: Secondary | ICD-10-CM | POA: Diagnosis not present

## 2021-11-18 DIAGNOSIS — M810 Age-related osteoporosis without current pathological fracture: Secondary | ICD-10-CM | POA: Diagnosis not present

## 2021-11-18 DIAGNOSIS — Z780 Asymptomatic menopausal state: Secondary | ICD-10-CM | POA: Diagnosis not present

## 2021-11-18 DIAGNOSIS — E2839 Other primary ovarian failure: Secondary | ICD-10-CM

## 2022-04-07 DIAGNOSIS — Z23 Encounter for immunization: Secondary | ICD-10-CM | POA: Diagnosis not present

## 2022-04-27 ENCOUNTER — Ambulatory Visit: Payer: Medicare Other | Admitting: Physician Assistant

## 2022-04-27 ENCOUNTER — Ambulatory Visit: Payer: Medicare Other | Admitting: Neurology

## 2022-05-11 ENCOUNTER — Ambulatory Visit: Payer: Medicare Other | Admitting: Physician Assistant

## 2022-05-11 ENCOUNTER — Encounter: Payer: Self-pay | Admitting: Physician Assistant

## 2022-05-11 VITALS — BP 183/82 | HR 78 | Resp 18 | Ht 59.0 in | Wt 117.0 lb

## 2022-05-11 DIAGNOSIS — G3184 Mild cognitive impairment, so stated: Secondary | ICD-10-CM

## 2022-05-11 NOTE — Progress Notes (Signed)
Assessment/Plan:   Mild Neurocognitive Disorder   Cathy Grimes is a very pleasant 87 y.o. RH female with a history of hypertension, hyperlipidemia and a history of mild cognitive impairment per Neuropsych evaluation in February 2021, likely of vascular etiology.  Prior MRI personally reviewed was remarkable for moderate diffuse atrophy and chronic microvascular disease.  She is presenting today in follow-up for evaluation of memory loss. Patient is on donepezil 10 mg daily, tolerating well. No significant cognitive changes since her last visit. She reports feeling somnolent during the day as she sleeps "too much " . She admits to not socializing and optimizing her memory with brain stimulating exercises. She is interested in attending a [place]     Recommendations:   Follow up in 6  months. Continue donepezil 10 mg daily Check hearing for improved comprehension  Agree with attending [place] to increase socialization and stimulation . Recommend good control of cardiovascular risk factors.   Continue to control mood as per PCP Will consider Neuropsych evaluation during next visit     Subjective:   This patient is accompanied in the office by her daughter\  who supplements the history. Previous records as well as any outside records available were reviewed prior to todays visit.   Patient was last seen on 09/25/2021 at which time her MMSE was stable at 28/30.  She is on donepezil 10 mg nightly    Any changes in memory since last visit?  About the same, some days "a little worse". Her daughter organizes the pills and last month for  20 days she did not take her Aricept , because she does not take her evening pills. . Patient has some difficulty remembering recent conversations and os okay people names , Likes to go church. Does not do any crossword puzzles or word finding.  repeats oneself?  Endorsed "3 times in 4 mile ride"-daughter says  Disoriented when walking into a  room?  Patient denies  Leaving objects in unusual places?  Patient denies   Wandering behavior?   denies   Any personality changes since last visit?   denies   Any worsening depression?: denies   Hallucinations or paranoia?  denies   Seizures?   denies    Any sleep changes?   She is "more sleepy during the day, takes more naps" . Denies vivid dreams, REM behavior or sleepwalking  . Not active enough. Family tried to make her walk outside and she does not want.  Sleep apnea?   denies   Any hygiene concerns?   denies   Independent of bathing and dressing?  Endorsed  Does the patient needs help with medications?  Daughter prepares the box is in charge  Who is in charge of the finances?   Son  and daughter are in charge     Any changes in appetite?  Sometimes she is not hungry   Patient have trouble swallowing?  denies   Does the patient cook?  Any kitchen accidents such as leaving the stove on?   denies   Any headaches?    denies   Vision changes? denies Chronic back pain  denies   Ambulates with difficulty?    denies   Recent falls or head injuries?    denies     Unilateral weakness, numbness or tingling?   denies   Any tremors?  denies   Any anosmia?    Chronic   Any incontinence of urine?  denies   Any bowel  dysfunction?  denies      Patient lives alone.  Her children checked behind her.  Does the patient drive?  Endorsed, denies getting lost , short distances, familiar places    History on Initial Assessment 09/13/2017: This is a pleasant 87 year old right-handed woman with a history of hypertension, hyperlipidemia, presenting for evaluation of memory loss. She notes her memory at times does bother her. Her son has been staying with her for a few months and would remind her that they had already seen the same show a few weeks ago. Her children would tell her she repeats herself. She lives alone and denies getting lost driving, no missed bills or medications. No word-finding  difficulties. She denies misplacing things frequently or leaving the stove on. She is active and exercises regularly. No report of personality changes, she denies any hallucinations. She is independent with all ADLs. MMSE at PCP office in March 2019 was 23/30.   She has noticed her sense of smell is not as keen as others, otherwise she denies any headaches, dizziness, diplopia, dysarthria/dysphagia, neck/back pain, focal numbness/tingling/weakness, bowel/bladder dysfunction, or tremors. There is no family history of dementia. She denies any history of significant head injuries. She drinks alcohol socially.    Diagnostic Data: Head CT done in 11/2015 showed diffusely enlarged ventricles and subarachnoid spaces, mild atrophy, minimal chronic microvascular disease.    Neuropsychological testing in February 2021 suggestive of primary impairment surrounding some aspects of executive functioning and information retrieval across memory measures, diagnosis of Mild Neurocognitive Disorder, etiology possibly vascular.   MRI brain in 10/2019 no acute changes, there was mild to moderate diffuse atrophy and chronic microvascular disease.   Past Medical History:  Diagnosis Date   Blood transfusion without reported diagnosis 1964   after miscarriage   Hyperlipidemia    Hypertension    Mild neurocognitive disorder    Sensorineural hearing loss (SNHL) of both ears 06/18/2016     Past Surgical History:  Procedure Laterality Date   TONSILLECTOMY       PREVIOUS MEDICATIONS:   CURRENT MEDICATIONS:  Outpatient Encounter Medications as of 05/11/2022  Medication Sig   alendronate (FOSAMAX) 70 MG tablet Take 70 mg by mouth once a week.   aspirin EC 81 MG tablet Take 81 mg by mouth daily.   atorvastatin (LIPITOR) 10 MG tablet Take 5 mg by mouth at bedtime.   Calcium Carbonate-Vitamin D (CALCIUM + D PO) Take by mouth 2 (two) times daily.   donepezil (ARICEPT) 10 MG tablet Take 1 tablet (10 mg total) by mouth at  bedtime.   meloxicam (MOBIC) 15 MG tablet Take 15 mg by mouth daily.   Multiple Vitamin (MULTIVITAMIN) tablet Take 1 tablet by mouth daily.   propranolol (INDERAL) 20 MG tablet Take 20 mg by mouth daily.   meloxicam (MOBIC) 7.5 MG tablet Take 7.5 mg by mouth every morning.   No facility-administered encounter medications on file as of 05/11/2022.     Objective:     PHYSICAL EXAMINATION:    VITALS:   Vitals:   05/11/22 1532  BP: (!) 183/82  Pulse: 78  Resp: 18  SpO2: 98%  Weight: 117 lb (53.1 kg)  Height: 4\' 11"  (1.499 m)    GEN:  The patient appears stated age and is in NAD. HEENT:  Normocephalic, atraumatic.   Neurological examination:  General: NAD, well-groomed, appears stated age. Orientation: The patient is alert. Oriented to person, place and date Cranial nerves: There is good facial symmetry.The  speech is fluent and clear. No aphasia or dysarthria. Fund of knowledge is appropriate. Recent memory impaired and remote memory is normal.  Attention and concentration are normal.  Able to name objects and repeat phrases.  Hearing is decreased  to conversational tone.   Sensation: Sensation is intact to light touch throughout Motor: Strength is at least antigravity x4. Tremors: none  DTR's 2/4 in UE/LE      09/14/2017    9:00 AM  Montreal Cognitive Assessment   Visuospatial/ Executive (0/5) 4  Naming (0/3) 3  Attention: Read list of digits (0/2) 2  Attention: Read list of letters (0/1) 1  Attention: Serial 7 subtraction starting at 100 (0/3) 3  Language: Repeat phrase (0/2) 2  Language : Fluency (0/1) 1  Abstraction (0/2) 2  Delayed Recall (0/5) 2  Orientation (0/6) 6  Total 26       09/25/2021    3:00 PM 07/04/2020    3:00 PM  MMSE - Mini Mental State Exam  Orientation to time 4 4  Orientation to Place 5 5  Registration 3 3  Attention/ Calculation 5 5  Recall 2 2  Language- name 2 objects 2 2  Language- repeat 1 1  Language- follow 3 step command 3 3   Language- read & follow direction 1 1  Write a sentence 1 1  Copy design 1 1  Total score 28 28       Movement examination: Tone: There is normal tone in the UE/LE Abnormal movements:  no tremor.  No myoclonus.  No asterixis.   Coordination:  There is no decremation with RAM's. Normal finger to nose  Gait and Station: The patient has no difficulty arising out of a deep-seated chair without the use of the hands. The patient's stride length is good.  Gait is cautious and narrow.   Thank you for allowing [place] the opportunity to participate in the care of this nice patient. Please do not hesitate to contact us for any questions or concerns.   Total time spent on today's visit was 30 minutes dedicated to this patient today, preparing to see patient, examining the patient, ordering tests and/or medications and counseling the patient, documenting clinical information in the EHR or other health record, independently interpreting results and communicating results to the patient/family, discussing treatment and goals, answering patient's questions and coordinating care.  Cc:  Mila Palmer, MD  Marlowe Kays 05/11/2022 5:27 PM

## 2022-05-11 NOTE — Patient Instructions (Signed)
Good to see you doing well.  Continue Donepezil 10mg  daily.  Follow-up in 6 months, call for any changes.   Consider [place]  [place], [place] [address] [phone]  Hours of Operation Mondays to Thursdays: 8 am to 8 pm,Fridays: 9 am to 8 pm, Saturdays: 9 am to 1 pm Sundays: Closed  [email or web address]/[email or web address]-[email or web address]    PLease feel free to call Arnell Asal, Social Worker at [employer] at [phone] for guidance   FALL PRECAUTIONS: Be cautious when walking. Scan the area for obstacles that may increase the risk of trips and falls. When getting up in the mornings, sit up at the edge of the bed for a few minutes before getting out of bed. Consider elevating the bed at the head end to avoid drop of blood pressure when getting up. Walk always in a well-lit room (use night lights in the walls). Avoid area rugs or power cords from appliances in the middle of the walkways. Use a walker or a cane if necessary and consider physical therapy for balance exercise. Get your eyesight checked regularly.  FINANCIAL OVERSIGHT: Supervision, especially oversight when making financial decisions or transactions is also recommended.  HOME SAFETY: Consider the safety of the kitchen when operating appliances like stoves, microwave oven, and blender. Consider having supervision and share cooking responsibilities until no longer able to participate in those. Accidents with firearms and other hazards in the house should be identified and addressed as well.  DRIVING: Regarding driving, in patients with progressive memory problems, driving will be impaired. We advise to have someone else do the driving if trouble finding directions or if minor accidents are reported. Independent driving assessment is available to determine safety of driving.  ABILITY TO BE LEFT ALONE: If patient is unable to contact 911  operator, consider using LifeLine, or when the need is there, arrange for someone to stay with patients. Smoking is a fire hazard, consider supervision or cessation. Risk of wandering should be assessed by caregiver and if detected at any point, supervision and safe proof recommendations should be instituted.  MEDICATION SUPERVISION: Inability to self-administer medication needs to be constantly addressed. Implement a mechanism to ensure safe administration of the medications.  RECOMMENDATIONS FOR ALL PATIENTS WITH MEMORY PROBLEMS: 1. Continue to exercise (Recommend 30 minutes of walking everyday, or 3 hours every week) 2. Increase social interactions - continue going to [place] and enjoy social gatherings with friends and family 3. Eat healthy, avoid fried foods and eat more fruits and vegetables 4. Maintain adequate blood pressure, blood sugar, and blood cholesterol level. Reducing the risk of stroke and cardiovascular disease also helps promoting better memory. 5. Avoid stressful situations. Live a simple life and avoid aggravations. Organize your time and prepare for the next day in anticipation. 6. Sleep well, avoid any interruptions of sleep and avoid any distractions in the bedroom that may interfere with adequate sleep quality 7. Avoid sugar, avoid sweets as there is a strong link between excessive sugar intake, diabetes, and cognitive impairment We discussed the Mediterranean diet, which has been shown to help patients reduce the risk of progressive memory disorders and reduces cardiovascular risk. This includes eating fish, eat fruits and green leafy vegetables, nuts like almonds and hazelnuts, walnuts, and also use olive oil. Avoid fast foods and fried foods as much as possible. Avoid sweets and sugar as sugar use has been linked to worsening of memory function.  There is always a concern of gradual progression of memory problems.  If this is the case, then we may need to adjust level of care  according to patient needs. Support, both to the patient and caregiver, should then be put into place.

## 2022-06-26 IMAGING — CT CT HEAD W/O CM
3 of 4 series · 14 of 47 positions shown, 16 images · non-contrast
Comparison: CT brain 12/17/2014

CLINICAL DATA: Headaches

EXAM:
CT HEAD WITHOUT CONTRAST
TECHNIQUE: Contiguous axial images were obtained from the base of the skull
through the vertex without intravenous contrast.

[Series 2: head 5.00 hr40 s3 axial ibhc · axial · 0.41mm/px · z∈[-675,-555]mm · 8 of 30 slices shown, 10 images]
[im 3/30  brain]
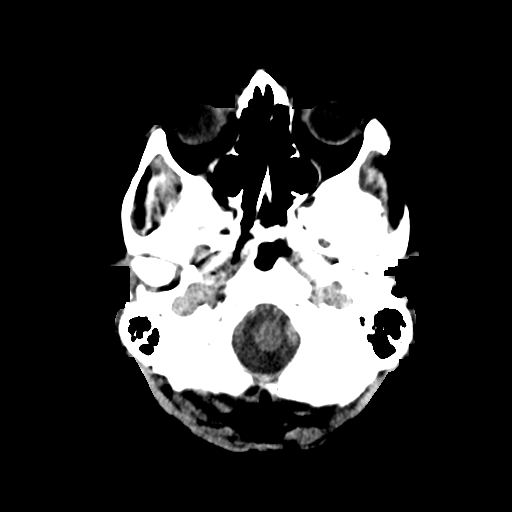
[im 3/30  bone]
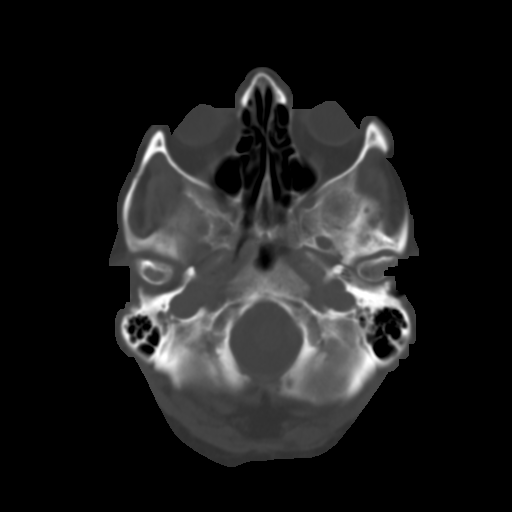
[im 7/30  brain]
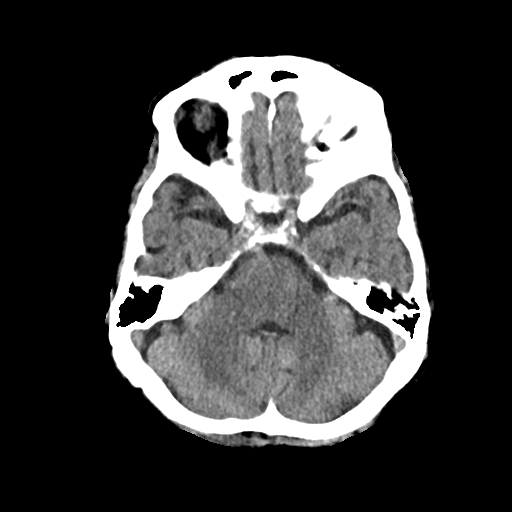
[im 11/30  brain]
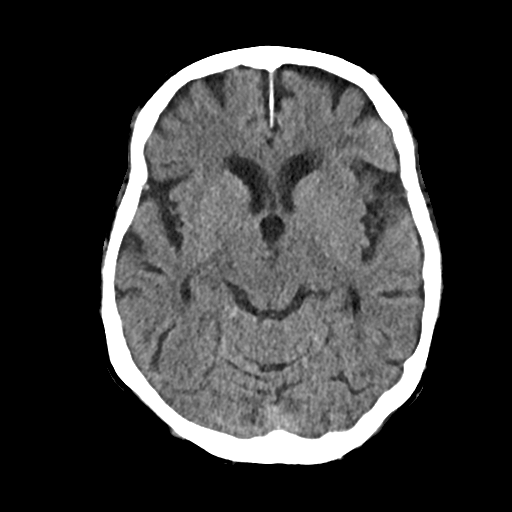
[im 13/30  brain]
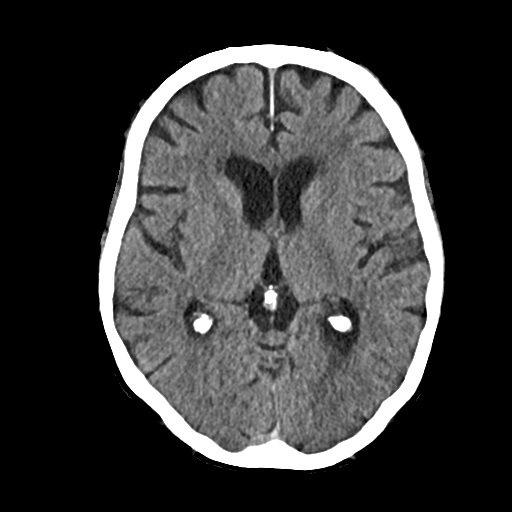
[im 17/30  brain]
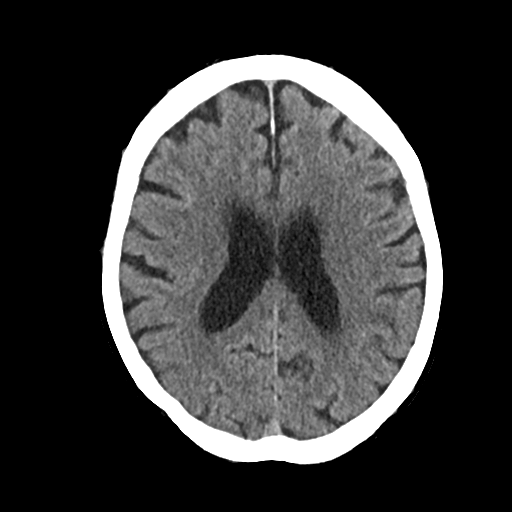
[im 17/30  bone]
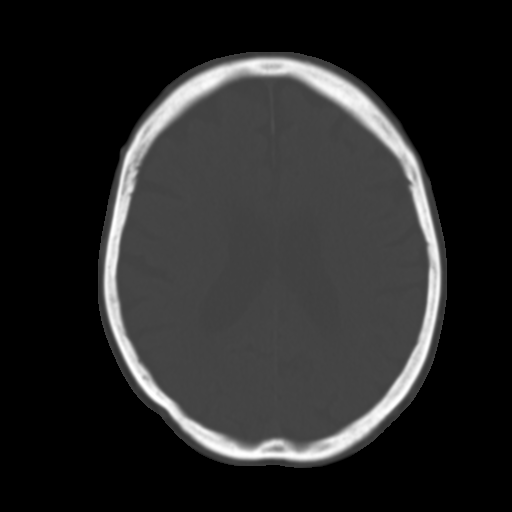
[im 19/30  brain]
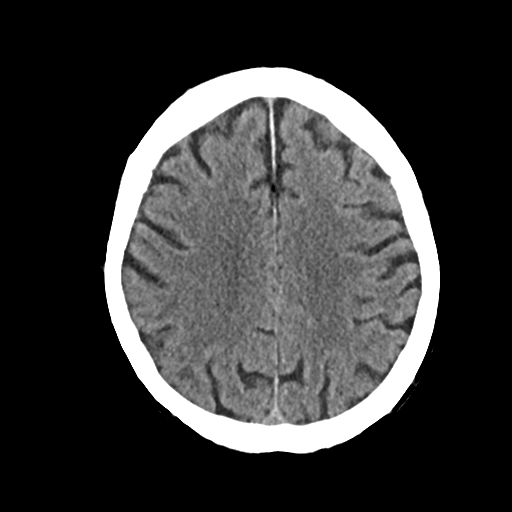
[im 23/30  brain]
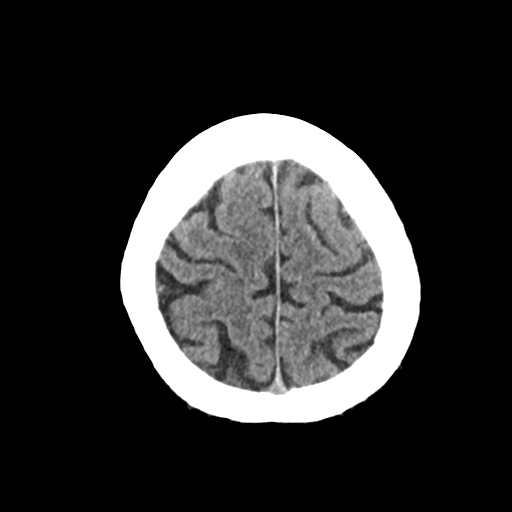
[im 27/30  brain]
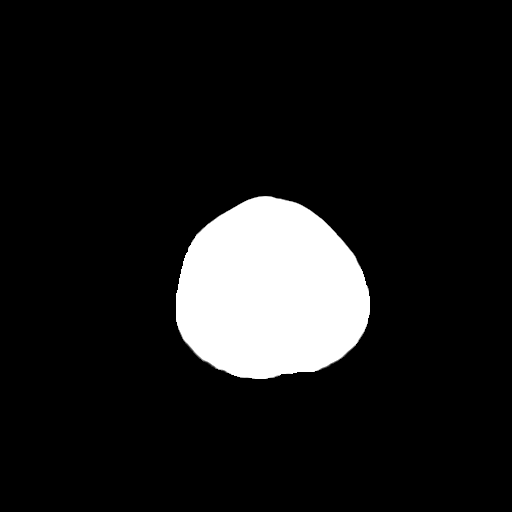

[Series 4: head 3.00 hr40 s3 sag · sagittal · 0.30mm/px · 3 of 69 slices shown]
[im 23/69  brain]
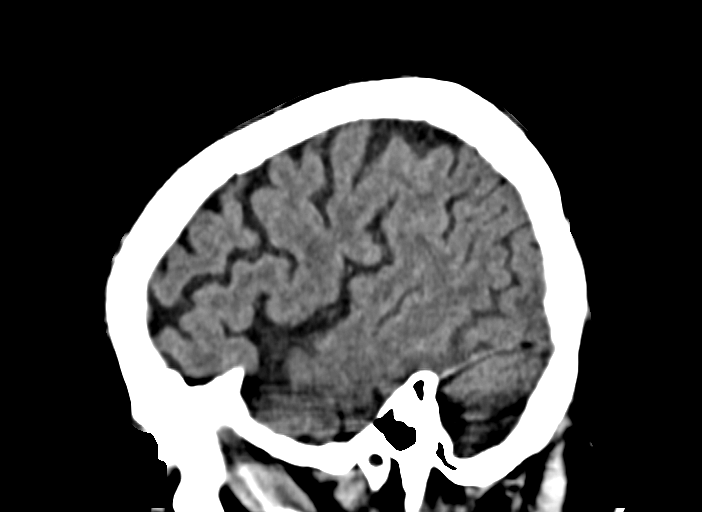
[im 35/69  brain]
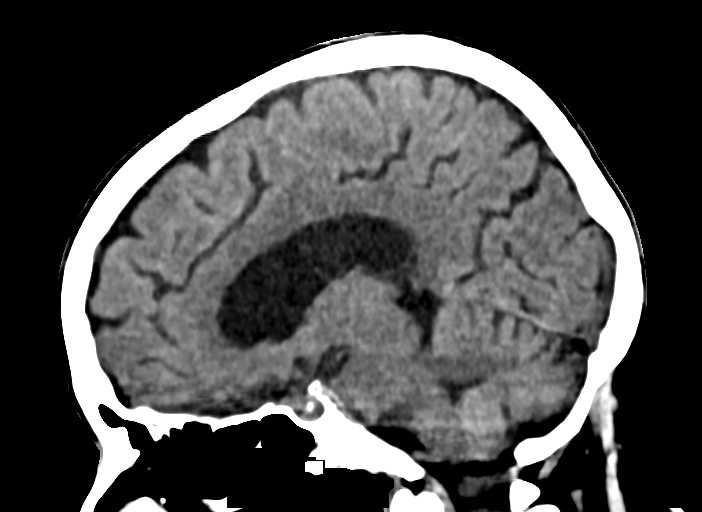
[im 46/69  brain]
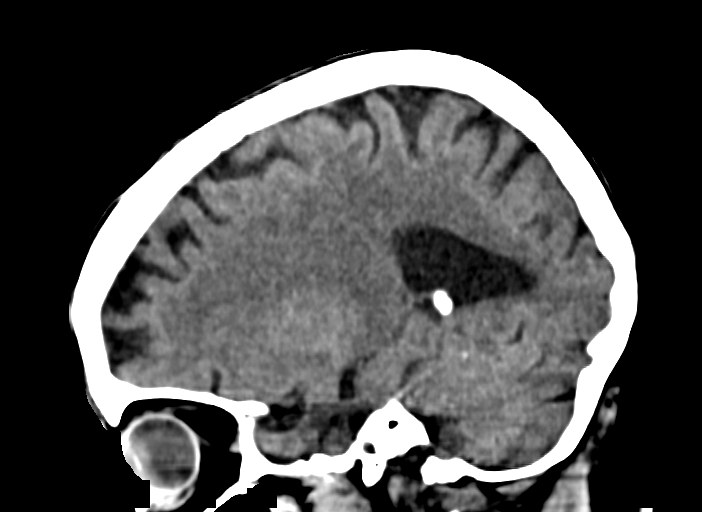

[Series 6: head 3.00 hr40 s3 cor · coronal · 0.30mm/px · 3 of 69 slices shown]
[im 23/69  brain]
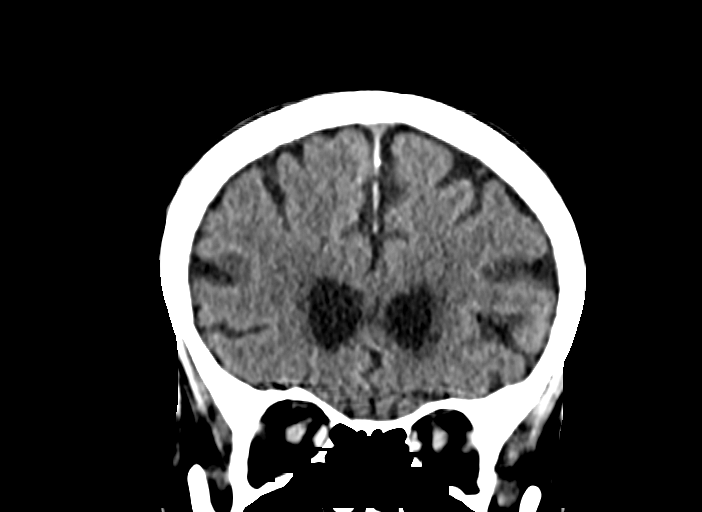
[im 31/69  brain]
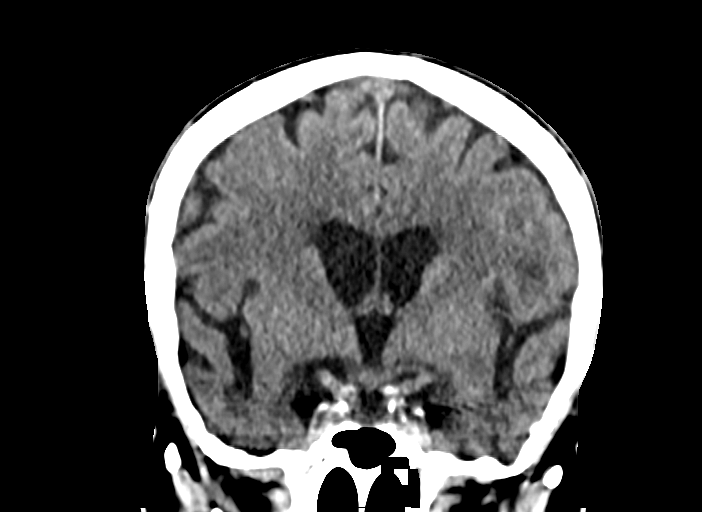
[im 38/69  brain]
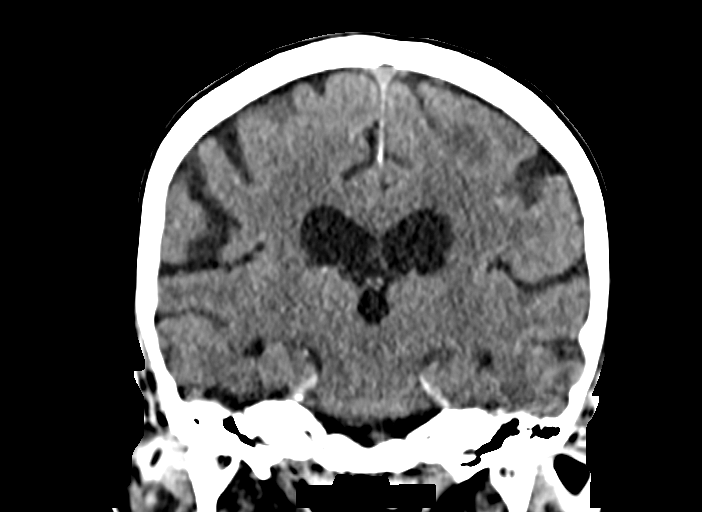

[14 of 47 positions shown; findings below may reference images not displayed]

FINDINGS: Brain: No acute territorial infarction, hemorrhage or intracranial
mass. Mild white matter hypodensity. Mild atrophy. Stable ventricle
size.

Vascular: No hyperdense vessels. Scattered carotid vascular
calcifications.

Skull: Normal. Negative for fracture or focal lesion.

Sinuses/Orbits: No acute finding.

Other: None
IMPRESSION: 1. No CT evidence for acute intracranial abnormality.
2. Atrophy and mild small vessel ischemic changes of the white
matter.

## 2022-10-01 ENCOUNTER — Other Ambulatory Visit: Payer: Self-pay | Admitting: Neurology

## 2022-10-19 LAB — AMB RESULTS CONSOLE CBG: Glucose: 103

## 2022-11-12 ENCOUNTER — Ambulatory Visit (INDEPENDENT_AMBULATORY_CARE_PROVIDER_SITE_OTHER): Payer: Medicare Other | Admitting: Neurology

## 2022-11-12 ENCOUNTER — Encounter: Payer: Self-pay | Admitting: Neurology

## 2022-11-12 VITALS — BP 131/58 | HR 69 | Ht 59.0 in | Wt 116.0 lb

## 2022-11-12 DIAGNOSIS — G3184 Mild cognitive impairment, so stated: Secondary | ICD-10-CM | POA: Diagnosis not present

## 2022-11-12 MED ORDER — DONEPEZIL HCL 10 MG PO TABS: ORAL_TABLET | ORAL | 3 refills | Status: DC

## 2022-11-12 NOTE — Patient Instructions (Addendum)
Good to see you. Continue Donepezil 10mg  daily. I recommend increasing physical activity, joining the tai chi classes, walking more regularly. Also restart word search, reading. Follow-up with Marlowe Kays PA-C in 6 months, call for any changes.   FALL PRECAUTIONS: Be cautious when walking. Scan the area for obstacles that may increase the risk of trips and falls. When getting up in the mornings, sit up at the edge of the bed for a few minutes before getting out of bed. Consider elevating the bed at the head end to avoid drop of blood pressure when getting up. Walk always in a well-lit room (use night lights in the walls). Avoid area rugs or power cords from appliances in the middle of the walkways. Use a walker or a cane if necessary and consider physical therapy for balance exercise. Get your eyesight checked regularly.  HOME SAFETY: Consider the safety of the kitchen when operating appliances like stoves, microwave oven, and blender. Consider having supervision and share cooking responsibilities until no longer able to participate in those. Accidents with firearms and other hazards in the house should be identified and addressed as well.  DRIVING: Regarding driving, in patients with progressive memory problems, driving will be impaired. We advise to have someone else do the driving if trouble finding directions or if minor accidents are reported. Independent driving assessment is available to determine safety of driving.  ABILITY TO BE LEFT ALONE: If patient is unable to contact 911 operator, consider using LifeLine, or when the need is there, arrange for someone to stay with patients. Smoking is a fire hazard, consider supervision or cessation. Risk of wandering should be assessed by caregiver and if detected at any point, supervision and safe proof recommendations should be instituted.  MEDICATION SUPERVISION: Inability to self-administer medication needs to be constantly addressed. Implement a  mechanism to ensure safe administration of the medications.  RECOMMENDATIONS FOR ALL PATIENTS WITH MEMORY PROBLEMS: 1. Continue to exercise (Recommend 30 minutes of walking everyday, or 3 hours every week) 2. Increase social interactions - continue going to [place] and enjoy social gatherings with friends and family 3. Eat healthy, avoid fried foods and eat more fruits and vegetables 4. Maintain adequate blood pressure, blood sugar, and blood cholesterol level. Reducing the risk of stroke and cardiovascular disease also helps promoting better memory. 5. Avoid stressful situations. Live a simple life and avoid aggravations. Organize your time and prepare for the next day in anticipation. 6. Sleep well, avoid any interruptions of sleep and avoid any distractions in the bedroom that may interfere with adequate sleep quality 7. Avoid sugar, avoid sweets as there is a strong link between excessive sugar intake, diabetes, and cognitive impairment The Mediterranean diet has been shown to help patients reduce the risk of progressive memory disorders and reduces cardiovascular risk. This includes eating fish, eat fruits and green leafy vegetables, nuts like almonds and hazelnuts, walnuts, and also use olive oil. Avoid fast foods and fried foods as much as possible. Avoid sweets and sugar as sugar use has been linked to worsening of memory function.  There is always a concern of gradual progression of memory problems. If this is the case, then we may need to adjust level of care according to patient needs. Support, both to the patient and caregiver, should then be put into place.

## 2022-11-12 NOTE — Progress Notes (Signed)
NEUROLOGY FOLLOW UP OFFICE NOTE  Cathy Grimes [id number] December 25, 1935  HISTORY OF PRESENT ILLNESS: I had the pleasure of seeing Cathy Grimes in follow-up in the neurology clinic on 11/12/2022.  The patient was last seen on by Memory Disorders PA Marlowe Kays 6 months ago for Mild Neurocognitive Disorder. She is again accompanied by her daughter who helps supplement the history today.  Records and images were personally reviewed where available.  MMSE 28/30 in June 2023. She is on Donepezil 10mg  daily. Since her last visit, they report memory is overall stable. She continues to drive, they deny any concerns. She is overall better taking medications with taking the Donepezil in the morning instead of night. Her daughter fills her box monthly and she may miss a quarter of it. Family manages finances. She lives alone, she is independent with dressing and bathing. No hygiene concerns but there is some less efficiency compared to prior with laundry and dishes. She denies any headaches, dizziness, vision changes, focal numbness/tingling/weakness. No falls. Mood is good. No hallucinations or paranoia. She is sleeping well. They report she has not been exercising, she has not attended Tai chi since January. She mostly watches TV at home.   History on Initial Assessment 09/13/2017: This is a pleasant 87 year old right-handed woman with a history of hypertension, hyperlipidemia, presenting for evaluation of memory loss. She notes her memory at times does bother her. Her son has been staying with her for a few months and would remind her that they had already seen the same show a few weeks ago. Her children would tell her she repeats herself. She lives alone and denies getting lost driving, no missed bills or medications. No word-finding difficulties. She denies misplacing things frequently or leaving the stove on. She is active and exercises regularly. No report of personality changes, she denies any  hallucinations. She is independent with all ADLs. MMSE at PCP office in March 2019 was 23/30.   She has noticed her sense of smell is not as keen as others, otherwise she denies any headaches, dizziness, diplopia, dysarthria/dysphagia, neck/back pain, focal numbness/tingling/weakness, bowel/bladder dysfunction, or tremors. There is no family history of dementia. She denies any history of significant head injuries. She drinks alcohol socially.    Diagnostic Data: Head CT done in 11/2015 showed diffusely enlarged ventricles and subarachnoid spaces, mild atrophy, minimal chronic microvascular disease.    Neuropsychological testing in February 2021 suggestive of primary impairment surrounding some aspects of executive functioning and information retrieval across memory measures, diagnosis of Mild Neurocognitive Disorder, etiology possibly vascular.   MRI brain in 10/2019 no acute changes, there was mild to moderate diffuse atrophy and chronic microvascular disease.     PAST MEDICAL HISTORY: Past Medical History:  Diagnosis Date   Blood transfusion without reported diagnosis 1964   after miscarriage   Hyperlipidemia    Hypertension    Mild neurocognitive disorder    Sensorineural hearing loss (SNHL) of both ears 06/18/2016    MEDICATIONS: Current Outpatient Medications on File Prior to Visit  Medication Sig Dispense Refill   alendronate (FOSAMAX) 70 MG tablet Take 70 mg by mouth once a week.     aspirin EC 81 MG tablet Take 81 mg by mouth daily.     atorvastatin (LIPITOR) 10 MG tablet Take 5 mg by mouth at bedtime.     Calcium Carbonate-Vitamin D (CALCIUM + D PO) Take by mouth 2 (two) times daily.     donepezil (ARICEPT) 10 MG tablet  TAKE 1 TABLET(10 MG) BY MOUTH AT BEDTIME 90 tablet 0   meloxicam (MOBIC) 15 MG tablet Take 15 mg by mouth daily.     meloxicam (MOBIC) 7.5 MG tablet Take 7.5 mg by mouth every morning.     Multiple Vitamin (MULTIVITAMIN) tablet Take 1 tablet by mouth daily.      propranolol (INDERAL) 20 MG tablet Take 20 mg by mouth daily.     No current facility-administered medications on file prior to visit.    ALLERGIES: Allergies  Allergen Reactions   Lisinopril Cough    FAMILY HISTORY: Family History  Problem Relation Age of Onset   Dementia Father        Possible at the end of her father's life (mid 61s)   Colon cancer Neg Hx     SOCIAL HISTORY: Social History   Socioeconomic History   Marital status: Married    Spouse name: Not on file   Number of children: Not on file   Years of education: 13   Highest education level: Some college, no degree  Occupational History   Occupation: Retired  Tobacco Use   Smoking status: Never   Smokeless tobacco: Never  Vaping Use   Vaping status: Never Used  Substance and Sexual Activity   Alcohol use: Yes    Alcohol/week: 7.0 standard drinks of alcohol    Types: 7 Glasses of wine per week    Comment: glass of wine with dinner   Drug use: No   Sexual activity: Not on file  Other Topics Concern   Not on file  Social History Narrative   Pt lives alone in 1 story home   Has 3 adult children son lives 3 home down and daughter lives 4 miles away   Some college education   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   Right handed   Social Determinants of Health   Financial Resource Strain: Not on file  Food Insecurity: No Food Insecurity (10/19/2022)   Hunger Vital Sign    Worried About Running Out of Food in the Last Year: Never true    Ran Out of Food in the Last Year: Never true  Transportation Needs: No Transportation Needs (10/25/2022)   PRAPARE - Administrator, Civil Service (Medical): No    Lack of Transportation (Non-Medical): No  Physical Activity: Not on file  Stress: Not on file  Social Connections: Not on file  Intimate Partner Violence: Not At Risk (10/19/2022)   Humiliation, Afraid, Rape, and Kick questionnaire    Fear of Current or Ex-Partner: No    Emotionally Abused: No    Physically Abused: No     Sexually Abused: No     PHYSICAL EXAM: Vitals:   11/12/22 0836  BP: (!) 131/58  Pulse: 69  SpO2: 98%   General: No acute distress Head:  Normocephalic/atraumatic Skin/Extremities: No rash, no edema Neurological Exam: alert and oriented to person, place, and time. No aphasia or dysarthria. Fund of knowledge is appropriate.  Recent and remote memory are impaired.  Attention and concentration are normal.  MMSE 27/30.     11/12/2022    8:00 AM 09/25/2021    3:00 PM 07/04/2020    3:00 PM  MMSE - Mini Mental State Exam  Orientation to time 4 4 4   Orientation to Place 5 5 5   Registration 3 3 3   Attention/ Calculation 5 5 5   Recall 1 2 2   Language- name 2 objects 2 2 2   Language- repeat 1 1 1  Language- follow 3 step command 3 3 3   Language- read & follow direction 1 1 1   Write a sentence 1 1 1   Copy design 1 1 1   Total score 27 28 28    Cranial nerves: Pupils equal, round. Extraocular movements intact with no nystagmus. Visual fields full.  No facial asymmetry.  Motor: Bulk and tone normal, muscle strength 5/5 throughout with no pronator drift.   Finger to nose testing intact.  Gait narrow-based and steady, no ataxia. No tremors.    IMPRESSION: This is a pleasant 88yo RH woman with a history of hypertension, hyperlipidemia, who presented in 2019 for memory loss. Neuropsychological testing in February 2021 indicated Mild Neurocognitive disorder, etiology possibly vascular. MRI brain showed mild to moderate diffuse atrophy and chronic microvascular disease. Symptoms overall stable, she continues to live alone. MMSE today 27/30 (28/30 in 09/2021). Continue Donepezil 10mg  daily. Continue control of vascular risk factors, encouraged to increase physical exercise and brain stimulation exercises. Continue to monitor driving. Follow-up with Memory Disorders PA Marlowe Kays in 6 months, call for any changes.    Thank you for allowing me to participate in her care.  Please do not hesitate to  call for any questions or concerns.    Patrcia Dolly, M.D.   CC: Dr. Paulino Rily

## 2022-12-06 DIAGNOSIS — F03A0 Unspecified dementia, mild, without behavioral disturbance, psychotic disturbance, mood disturbance, and anxiety: Secondary | ICD-10-CM | POA: Diagnosis not present

## 2022-12-06 DIAGNOSIS — M810 Age-related osteoporosis without current pathological fracture: Secondary | ICD-10-CM | POA: Diagnosis not present

## 2022-12-06 DIAGNOSIS — Z0000 Encounter for general adult medical examination without abnormal findings: Secondary | ICD-10-CM | POA: Diagnosis not present

## 2022-12-06 DIAGNOSIS — I10 Essential (primary) hypertension: Secondary | ICD-10-CM | POA: Diagnosis not present

## 2022-12-06 DIAGNOSIS — E785 Hyperlipidemia, unspecified: Secondary | ICD-10-CM | POA: Diagnosis not present

## 2023-05-16 ENCOUNTER — Ambulatory Visit: Payer: Medicare Other | Admitting: Physician Assistant

## 2023-05-16 ENCOUNTER — Encounter: Payer: Self-pay | Admitting: Physician Assistant

## 2023-05-16 VITALS — BP 133/82 | HR 90 | Ht 59.0 in | Wt 119.0 lb

## 2023-05-16 DIAGNOSIS — G3184 Mild cognitive impairment, so stated: Secondary | ICD-10-CM | POA: Diagnosis not present

## 2023-05-16 NOTE — Progress Notes (Signed)
Assessment/Plan:   Memory Impairment   Cathy Grimes is a very pleasant 88 y.o. RH female with a history of hypertension, hyperlipidemia, who presented in 2019 for memory loss. Neuropsychological testing in February 2021 indicated Mild Neurocognitive disorder, etiology possibly vascular. She is presenting today in follow-up for evaluation of memory loss. Patient is on donepezil 10 mg daily. MMSE is stable at 28/30. Patient is able to participate on his ADLs and to drive without difficulties. Discussed increasing activities and socialization for improvement of cognitive status.     Recommendations:   Follow up in 6  months with Dr. Karel Jarvis. Continue donepezil 10 mg daily. Side effects were discussed  Recommend good control of cardiovascular risk factors Continue to control mood as per PCP Check hearing for improve comprehension.    Subjective:   This patient is accompanied in the office by her daughter who supplements the history. Previous records as well as any outside records available were reviewed prior to todays visit.  Patient was last seen on 11/12/22 by Dr. Karel Jarvis.  Last MMSE on June 2023 was 28/30.     Any changes in memory since last visit? "STM may be declining a little"-daughter says.  During the day she mostly watches TV at home.  She admits to not socializing enough or doing brain stimulating exercises. She likes going to [place].  repeats oneself?  Endorsed Disoriented when walking into a room?  Patient denies    Misplacing objects?  Patient denies   Wandering behavior?   denies   Any personality changes since last visit?   denies   Any worsening depression?: denies   Hallucinations or paranoia?  denies   Seizures?   denies    Any sleep changes? Sleeps well. Denies vivid dreams, REM behavior or sleepwalking   Sleep apnea?   Denies.    Any hygiene concerns?   Denies.   Independent of bathing and dressing?  Endorsed  Does the patient needs help with medications?   Her daughter feels the pillbox monthly, she may miss some doses.  Who is in charge of the finances?  Family is in charge. Any changes in appetite?  Slightly decreased after issues with GERD, she is back on her meds which is helpful.  Patient have trouble swallowing?  Denies.   Does the patient cook?  Not much. She gets pre ready meals from [employer] . Any kitchen accidents such as leaving the stove on?   denies   Any headaches?    denies   Vision changes? denies Chronic pain?  denies   Ambulates with difficulty?  Denies.  She admits to not exercising on a frequent basis.  She has not been attending Tai Chi or go to the [facility]   Recent falls or head injuries?    Denies.      Unilateral weakness, numbness or tingling?   Denies.   Any tremors?  Denies.   Any anosmia?   Decreased. Any incontinence of urine?  Denies.   Any bowel dysfunction?  Denies.      Patient lives alone   Does the patient drive?  Yes, short distances and to familiar places denies any issues.    History on Initial Assessment 09/13/2017: This is a pleasant 88 year old right-handed woman with a history of hypertension, hyperlipidemia, presenting for evaluation of memory loss. She notes her memory at times does bother her. Her son has been staying with her for a few months and would remind her that they  had already seen the same show a few weeks ago. Her children would tell her she repeats herself. She lives alone and denies getting lost driving, no missed bills or medications. No word-finding difficulties. She denies misplacing things frequently or leaving the stove on. She is active and exercises regularly. No report of personality changes, she denies any hallucinations. She is independent with all ADLs. MMSE at PCP office in March 2019 was 23/30. She has noticed her sense of smell is not as keen as others, otherwise she denies any headaches, dizziness, diplopia, dysarthria/dysphagia, neck/back pain, focal  numbness/tingling/weakness, bowel/bladder dysfunction, or tremors. There is no family history of dementia. She denies any history of significant head injuries. She drinks alcohol socially.    Diagnostic Data: Head CT done in 11/2015 showed diffusely enlarged ventricles and subarachnoid spaces, mild atrophy, minimal chronic microvascular disease.    Neuropsychological testing in February 2021 suggestive of primary impairment surrounding some aspects of executive functioning and information retrieval across memory measures, diagnosis of Mild Neurocognitive Disorder, etiology possibly vascular.   MRI brain in 10/2019 personally reviewed no acute changes, there was mild to moderate diffuse atrophy and chronic microvascular disease.   Past Medical History:  Diagnosis Date   Blood transfusion without reported diagnosis 1964   after miscarriage   Hyperlipidemia    Hypertension    Mild neurocognitive disorder    Sensorineural hearing loss (SNHL) of both ears 06/18/2016     Past Surgical History:  Procedure Laterality Date   TONSILLECTOMY       PREVIOUS MEDICATIONS:   CURRENT MEDICATIONS:  Outpatient Encounter Medications as of 05/16/2023  Medication Sig   alendronate (FOSAMAX) 70 MG tablet Take 70 mg by mouth once a week.   aspirin EC 81 MG tablet Take 81 mg by mouth daily.   atorvastatin (LIPITOR) 10 MG tablet Take 5 mg by mouth at bedtime.   Calcium Carbonate-Vitamin D (CALCIUM + D PO) Take by mouth 2 (two) times daily.   donepezil (ARICEPT) 10 MG tablet Take 1 tablet daily   meloxicam (MOBIC) 7.5 MG tablet Take 7.5 mg by mouth every morning. prn   Multiple Vitamin (MULTIVITAMIN) tablet Take 1 tablet by mouth daily.   propranolol (INDERAL) 20 MG tablet Take 20 mg by mouth daily.   No facility-administered encounter medications on file as of 05/16/2023.     Objective:     PHYSICAL EXAMINATION:    VITALS:   Vitals:   05/16/23 0907  BP: 133/82  Pulse: 90  SpO2: 96%  Weight:  119 lb (54 kg)  Height: 4\' 11"  (1.499 m)    GEN:  The patient appears stated age and is in NAD. HEENT:  Normocephalic, atraumatic.   Neurological examination:  General: NAD, well-groomed, appears stated age. Orientation: The patient is alert. Oriented to person, place and not to date Cranial nerves: There is good facial symmetry.The speech is fluent and clear. No aphasia or dysarthria. Fund of knowledge is appropriate. Recent memory impaired and remote memory is normal.  Attention and concentration are normal.  Able to name objects and repeat phrases.  Hearing is slightly decreased to conversational tone.   Delayed recall 3/3 Sensation: Sensation is intact to light touch throughout Motor: Strength is at least antigravity x4. DTR's 2/4 in UE/LE      09/14/2017    9:00 AM  Montreal Cognitive Assessment   Visuospatial/ Executive (0/5) 4  Naming (0/3) 3  Attention: Read list of digits (0/2) 2  Attention: Read list of letters (  0/1) 1  Attention: Serial 7 subtraction starting at 100 (0/3) 3  Language: Repeat phrase (0/2) 2  Language : Fluency (0/1) 1  Abstraction (0/2) 2  Delayed Recall (0/5) 2  Orientation (0/6) 6  Total 26       05/16/2023    9:00 AM 11/12/2022    8:00 AM 09/25/2021    3:00 PM  MMSE - Mini Mental State Exam  Orientation to time 3 4 4   Orientation to Place 5 5 5   Registration 3 3 3   Attention/ Calculation 5 5 5   Recall 3 1 2   Language- name 2 objects 2 2 2   Language- repeat 1 1 1   Language- follow 3 step command 3 3 3   Language- read & follow direction 1 1 1   Write a sentence 1 1 1   Copy design 1 1 1   Total score 28 27 28        Movement examination: Tone: There is normal tone in the UE/LE Abnormal movements:  no tremor.  No myoclonus.  No asterixis.   Coordination:  There is no decremation with RAM's. Normal finger to nose  Gait and Station: The patient has no difficulty arising out of a deep-seated chair without the use of the hands. The patient's  stride length is good.  Gait is cautious and narrow.   Thank you for allowing [place] the opportunity to participate in the care of this nice patient. Please do not hesitate to contact us for any questions or concerns.   Total time spent on today's visit was 31 minutes dedicated to this patient today, preparing to see patient, examining the patient, ordering tests and/or medications and counseling the patient, documenting clinical information in the EHR or other health record, independently interpreting results and communicating results to the patient/family, discussing treatment and goals, answering patient's questions and coordinating care.  Cc:  Mila Palmer, MD  Marlowe Kays 05/16/2023 9:58 AM

## 2023-05-16 NOTE — Patient Instructions (Signed)
Good to see you. Continue Donepezil 10mg  daily. I recommend increasing physical activity, joining the tai chi classes, walking more regularly. Also restart word search, reading. Follow-up with Dr. Karel Jarvis in 6 months, call for any changes.   FALL PRECAUTIONS: Be cautious when walking. Scan the area for obstacles that may increase the risk of trips and falls. When getting up in the mornings, sit up at the edge of the bed for a few minutes before getting out of bed. Consider elevating the bed at the head end to avoid drop of blood pressure when getting up. Walk always in a well-lit room (use night lights in the walls). Avoid area rugs or power cords from appliances in the middle of the walkways. Use a walker or a cane if necessary and consider physical therapy for balance exercise. Get your eyesight checked regularly.  HOME SAFETY: Consider the safety of the kitchen when operating appliances like stoves, microwave oven, and blender. Consider having supervision and share cooking responsibilities until no longer able to participate in those. Accidents with firearms and other hazards in the house should be identified and addressed as well.  DRIVING: Regarding driving, in patients with progressive memory problems, driving will be impaired. We advise to have someone else do the driving if trouble finding directions or if minor accidents are reported. Independent driving assessment is available to determine safety of driving.  ABILITY TO BE LEFT ALONE: If patient is unable to contact 911 operator, consider using LifeLine, or when the need is there, arrange for someone to stay with patients. Smoking is a fire hazard, consider supervision or cessation. Risk of wandering should be assessed by caregiver and if detected at any point, supervision and safe proof recommendations should be instituted.  MEDICATION SUPERVISION: Inability to self-administer medication needs to be constantly addressed. Implement a mechanism to  ensure safe administration of the medications.  RECOMMENDATIONS FOR ALL PATIENTS WITH MEMORY PROBLEMS: 1. Continue to exercise (Recommend 30 minutes of walking everyday, or 3 hours every week) 2. Increase social interactions - continue going to [place] and enjoy social gatherings with friends and family 3. Eat healthy, avoid fried foods and eat more fruits and vegetables 4. Maintain adequate blood pressure, blood sugar, and blood cholesterol level. Reducing the risk of stroke and cardiovascular disease also helps promoting better memory. 5. Avoid stressful situations. Live a simple life and avoid aggravations. Organize your time and prepare for the next day in anticipation. 6. Sleep well, avoid any interruptions of sleep and avoid any distractions in the bedroom that may interfere with adequate sleep quality 7. Avoid sugar, avoid sweets as there is a strong link between excessive sugar intake, diabetes, and cognitive impairment The Mediterranean diet has been shown to help patients reduce the risk of progressive memory disorders and reduces cardiovascular risk. This includes eating fish, eat fruits and green leafy vegetables, nuts like almonds and hazelnuts, walnuts, and also use olive oil. Avoid fast foods and fried foods as much as possible. Avoid sweets and sugar as sugar use has been linked to worsening of memory function.  There is always a concern of gradual progression of memory problems. If this is the case, then we may need to adjust level of care according to patient needs. Support, both to the patient and caregiver, should then be put into place.

## 2023-06-01 DIAGNOSIS — E785 Hyperlipidemia, unspecified: Secondary | ICD-10-CM | POA: Diagnosis not present

## 2023-06-01 DIAGNOSIS — K30 Functional dyspepsia: Secondary | ICD-10-CM | POA: Diagnosis not present

## 2023-06-01 DIAGNOSIS — I10 Essential (primary) hypertension: Secondary | ICD-10-CM | POA: Diagnosis not present

## 2023-08-24 ENCOUNTER — Other Ambulatory Visit: Payer: Self-pay | Admitting: Neurology

## 2023-11-14 ENCOUNTER — Ambulatory Visit: Payer: Medicare Other | Admitting: Neurology

## 2023-12-16 DIAGNOSIS — R7301 Impaired fasting glucose: Secondary | ICD-10-CM | POA: Diagnosis not present

## 2023-12-16 DIAGNOSIS — E785 Hyperlipidemia, unspecified: Secondary | ICD-10-CM | POA: Diagnosis not present

## 2023-12-16 DIAGNOSIS — Z0000 Encounter for general adult medical examination without abnormal findings: Secondary | ICD-10-CM | POA: Diagnosis not present

## 2023-12-16 DIAGNOSIS — E559 Vitamin D deficiency, unspecified: Secondary | ICD-10-CM | POA: Diagnosis not present

## 2023-12-16 DIAGNOSIS — K30 Functional dyspepsia: Secondary | ICD-10-CM | POA: Diagnosis not present

## 2023-12-16 DIAGNOSIS — M810 Age-related osteoporosis without current pathological fracture: Secondary | ICD-10-CM | POA: Diagnosis not present

## 2023-12-16 DIAGNOSIS — I10 Essential (primary) hypertension: Secondary | ICD-10-CM | POA: Diagnosis not present

## 2023-12-20 ENCOUNTER — Other Ambulatory Visit (HOSPITAL_BASED_OUTPATIENT_CLINIC_OR_DEPARTMENT_OTHER): Payer: Self-pay | Admitting: Family Medicine

## 2023-12-20 DIAGNOSIS — E2839 Other primary ovarian failure: Secondary | ICD-10-CM

## 2023-12-27 ENCOUNTER — Other Ambulatory Visit: Payer: Self-pay | Admitting: Neurology

## 2023-12-30 ENCOUNTER — Other Ambulatory Visit: Payer: Self-pay | Admitting: Neurology

## 2024-03-27 ENCOUNTER — Ambulatory Visit: Admitting: Neurology

## 2024-03-27 ENCOUNTER — Encounter: Payer: Self-pay | Admitting: Neurology

## 2024-03-27 VITALS — BP 148/66 | HR 88 | Ht 59.75 in | Wt 118.2 lb

## 2024-03-27 DIAGNOSIS — G3184 Mild cognitive impairment, so stated: Secondary | ICD-10-CM | POA: Diagnosis not present

## 2024-03-27 MED ORDER — DONEPEZIL HCL 10 MG PO TABS: 10.0000 mg | ORAL_TABLET | Freq: Every day | ORAL | 4 refills | Status: AC

## 2024-03-27 NOTE — Patient Instructions (Signed)
 It's always a pleasure to see you. Continue Donepezil  10mg  daily.  Continue working on blood pressure, cholesterol, sugar levels. Start looking into going back to the [facility] for regular exercise.   Continue working on getting used to your new hearing aids.  Follow-up in 1 year, call for any changes   FALL PRECAUTIONS: Be cautious when walking. Scan the area for obstacles that may increase the risk of trips and falls. When getting up in the mornings, sit up at the edge of the bed for a few minutes before getting out of bed. Consider elevating the bed at the head end to avoid drop of blood pressure when getting up. Walk always in a well-lit room (use night lights in the walls). Avoid area rugs or power cords from appliances in the middle of the walkways. Use a walker or a cane if necessary and consider physical therapy for balance exercise. Get your eyesight checked regularly.  FINANCIAL OVERSIGHT: Supervision, especially oversight when making financial decisions or transactions is also recommended.  HOME SAFETY: Consider the safety of the kitchen when operating appliances like stoves, microwave oven, and blender. Consider having supervision and share cooking responsibilities until no longer able to participate in those. Accidents with firearms and other hazards in the house should be identified and addressed as well.  DRIVING: Regarding driving, in patients with progressive memory problems, driving will be impaired. We advise to have someone else do the driving if trouble finding directions or if minor accidents are reported. Independent driving assessment is available to determine safety of driving.  ABILITY TO BE LEFT ALONE: If patient is unable to contact 911 operator, consider using LifeLine, or when the need is there, arrange for someone to stay with patients. Smoking is a fire hazard, consider supervision or cessation. Risk of wandering should be assessed by caregiver and if detected at any  point, supervision and safe proof recommendations should be instituted.  MEDICATION SUPERVISION: Inability to self-administer medication needs to be constantly addressed. Implement a mechanism to ensure safe administration of the medications.  RECOMMENDATIONS FOR ALL PATIENTS WITH MEMORY PROBLEMS: 1. Continue to exercise (Recommend 30 minutes of walking everyday, or 3 hours every week) 2. Increase social interactions - continue going to [place] and enjoy social gatherings with friends and family 3. Eat healthy, avoid fried foods and eat more fruits and vegetables 4. Maintain adequate blood pressure, blood sugar, and blood cholesterol level. Reducing the risk of stroke and cardiovascular disease also helps promoting better memory. 5. Avoid stressful situations. Live a simple life and avoid aggravations. Organize your time and prepare for the next day in anticipation. 6. Sleep well, avoid any interruptions of sleep and avoid any distractions in the bedroom that may interfere with adequate sleep quality 7. Avoid sugar, avoid sweets as there is a strong link between excessive sugar intake, diabetes, and cognitive impairment We discussed the Mediterranean diet, which has been shown to help patients reduce the risk of progressive memory disorders and reduces cardiovascular risk. This includes eating fish, eat fruits and green leafy vegetables, nuts like almonds and hazelnuts, walnuts, and also use olive oil. Avoid fast foods and fried foods as much as possible. Avoid sweets and sugar as sugar use has been linked to worsening of memory function.  There is always a concern of gradual progression of memory problems. If this is the case, then we may need to adjust level of care according to patient needs. Support, both to the patient and caregiver, should then be put  into place.

## 2024-03-27 NOTE — Progress Notes (Signed)
 NEUROLOGY FOLLOW UP OFFICE NOTE  Cathy Grimes [id number] [birth year]  Discussed the use of AI scribe software for clinical note transcription with the patient, who gave verbal consent to proceed.  History of Present Illness I had the pleasure of seeing Cathy Grimes in follow-up in the neurology clinic on 03/27/2024. She is again accompanied by her daughter who helps supplement the history today. The patient was last seen by Memory Disorders PA Camie Sevin in January 2025 for Mild Neurocognitive Disorder, likely vascular. MMSE 28/30 in 04/2023. She is on Donepezil  10mg  daily without side effects.    Her memory has been stable over the years with no significant changes. She continues to manage her daily activities independently, including driving short distances to her house, church, and the grocery store. She avoids driving at night and has not had any accidents or incidents of getting lost. Her daughter expresses some concern about her reaction time while driving.  She manages her medications with a monthly pill organizer, although she occasionally forgets to take her medications. She places the pill organizer by her coffee maker to remind herself. She is currently taking donepezil . She has stopped walking at the [facility] since COVID-19 and has not resumed. She manages her finances with cash to avoid issues with scams and no longer uses a charge card.  Her hearing is good when she wears her hearing aids, but she often forgets to wear them. She prefers in-ear hearing aids over the over-the-ear type due to comfort issues. She has had three sets of hearing aids, with the current set being six months old.  No issues with mood, paranoia, or hallucinations. No headaches, dizziness, numbness, tingling, or weakness in her arms and legs. No falls and generally sleeps well.     History on Initial Assessment 09/13/2017: This is a pleasant 88 year old right-handed woman with a history of hypertension,  hyperlipidemia, presenting for evaluation of memory loss. She notes her memory at times does bother her. Her son has been staying with her for a few months and would remind her that they had already seen the same show a few weeks ago. Her children would tell her she repeats herself. She lives alone and denies getting lost driving, no missed bills or medications. No word-finding difficulties. She denies misplacing things frequently or leaving the stove on. She is active and exercises regularly. No report of personality changes, she denies any hallucinations. She is independent with all ADLs. MMSE at PCP office in March 2019 was 23/30.   She has noticed her sense of smell is not as keen as others, otherwise she denies any headaches, dizziness, diplopia, dysarthria/dysphagia, neck/back pain, focal numbness/tingling/weakness, bowel/bladder dysfunction, or tremors. There is no family history of dementia. She denies any history of significant head injuries. She drinks alcohol socially.    Diagnostic Data: Head CT done in 11/2015 showed diffusely enlarged ventricles and subarachnoid spaces, mild atrophy, minimal chronic microvascular disease.    Neuropsychological testing in February 2021 suggestive of primary impairment surrounding some aspects of executive functioning and information retrieval across memory measures, diagnosis of Mild Neurocognitive Disorder, etiology possibly vascular.   MRI brain in 10/2019 no acute changes, there was mild to moderate diffuse atrophy and chronic microvascular disease.     PAST MEDICAL HISTORY: Past Medical History:  Diagnosis Date   Blood transfusion without reported diagnosis 1964   after miscarriage   Hyperlipidemia    Hypertension    Mild neurocognitive disorder    Sensorineural hearing  loss (SNHL) of both ears 06/18/2016    MEDICATIONS: Current Outpatient Medications on File Prior to Visit  Medication Sig Dispense Refill   alendronate (FOSAMAX) 70 MG tablet  Take 70 mg by mouth once a week.     aspirin EC 81 MG tablet Take 81 mg by mouth daily.     atorvastatin (LIPITOR) 10 MG tablet Take 5 mg by mouth at bedtime.     Calcium Carbonate-Vitamin D (CALCIUM + D PO) Take by mouth 2 (two) times daily.     donepezil  (ARICEPT ) 10 MG tablet TAKE 1 TABLET BY MOUTH DAILY 90 tablet 1   meloxicam (MOBIC) 7.5 MG tablet Take 7.5 mg by mouth every morning. prn     Multiple Vitamin (MULTIVITAMIN) tablet Take 1 tablet by mouth daily.     omeprazole (PRILOSEC) 20 MG capsule Take 20 mg by mouth every morning.     propranolol (INDERAL) 20 MG tablet Take 20 mg by mouth daily.     No current facility-administered medications on file prior to visit.    ALLERGIES: Allergies  Allergen Reactions   Lisinopril Cough    FAMILY HISTORY: Family History  Problem Relation Age of Onset   Dementia Father        Possible at the end of her father's life (mid 50s)   Colon cancer Neg Hx     SOCIAL HISTORY: Social History   Socioeconomic History   Marital status: Married    Spouse name: Not on file   Number of children: Not on file   Years of education: 13   Highest education level: Some college, no degree  Occupational History   Occupation: Retired  Tobacco Use   Smoking status: Never   Smokeless tobacco: Never  Vaping Use   Vaping status: Never Used  Substance and Sexual Activity   Alcohol use: Yes    Alcohol/week: 7.0 standard drinks of alcohol    Types: 7 Glasses of wine per week    Comment: glass of wine with dinner   Drug use: No   Sexual activity: Not on file  Other Topics Concern   Not on file  Social History Narrative   Pt lives alone in 1 story home   Has 3 adult children son lives 3 home down and daughter lives 4 miles away   Some college education   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   Right handed   Social Drivers of Health   Financial Resource Strain: Not on file  Food Insecurity: No Food Insecurity (10/19/2022)   Hunger Vital Sign    Worried About Running  Out of Food in the Last Year: Never true    Ran Out of Food in the Last Year: Never true  Transportation Needs: No Transportation Needs (10/25/2022)   PRAPARE - Administrator, Civil Service (Medical): No    Lack of Transportation (Non-Medical): No  Physical Activity: Not on file  Stress: Not on file  Social Connections: Not on file  Intimate Partner Violence: Not At Risk (10/19/2022)   Humiliation, Afraid, Rape, and Kick questionnaire    Fear of Current or Ex-Partner: No    Emotionally Abused: No    Physically Abused: No    Sexually Abused: No     PHYSICAL EXAM: Vitals:   03/27/24 0956  BP: (!) 148/66  Pulse: 88  SpO2: 96%   General: No acute distress Head:  Normocephalic/atraumatic Skin/Extremities: No rash, no edema Neurological Exam: alert and oriented to person, place, and time. Initially year was  2030, then corrected to 2025. No aphasia or dysarthria. Fund of knowledge is appropriate.  Recent and remote memory are intact.  Attention and concentration are normal. MMSE 30/30    03/27/2024   10:00 AM 05/16/2023    9:00 AM 11/12/2022    8:00 AM  MMSE - Mini Mental State Exam  Orientation to time 5 3 4   Orientation to Place 5 5 5   Registration 3 3 3   Attention/ Calculation 5 5 5   Recall 3 3 1   Language- name 2 objects 2 2 2   Language- repeat 1 1 1   Language- follow 3 step command 3 3 3   Language- read & follow direction 1 1 1   Write a sentence 1 1 1   Copy design 1 1 1   Total score 30 28 27    Cranial nerves: Pupils equal, round. Extraocular movements intact with no nystagmus. Visual fields full.  No facial asymmetry.  Motor: Bulk and tone normal, muscle strength 5/5 throughout with no pronator drift.   Finger to nose testing intact.  Gait narrow-based and steady, no ataxia. No tremors.  Assessment and Plan Assessment & Plan This is a pleasant 88 yo RH woman with a history of hypertension, hyperlipidemia, who presented in 2019 for memory loss.  Neuropsychological testing in February 2021 indicated Mild Neurocognitive disorder, etiology possibly vascular. MRI brain showed mild to moderate diffuse atrophy and chronic microvascular disease. Symptoms well-managed with stable memory and cognitive function. MMSE today 30/30. Emphasized safety, medication adherence, and driving. - Continue donepezil  10 MG oral at bedtime. - Consider pill box with alarm for medication adherence. - Monitor driving safety and discuss hearing aid use with audiologist. - Encouraged healthy lifestyle including exercise and diet, control of vascular risk factors.  Hearing loss Managed with hearing aids. Prefers in-ear models but uses over-the-ear models. Discussed importance for safety and cognitive function. - Discuss hearing aid preferences with audiologist. - Encourage consistent use of hearing aids, especially in public settings.  Follow-up in 1 year, call for any changes.  Thank you for allowing me to participate in her care.  Please do not hesitate to call for any questions or concerns.    Darice Shivers, M.D.   CC: Dr. Verena

## 2024-05-04 ENCOUNTER — Ambulatory Visit (HOSPITAL_BASED_OUTPATIENT_CLINIC_OR_DEPARTMENT_OTHER)
Admission: RE | Admit: 2024-05-04 | Discharge: 2024-05-04 | Disposition: A | Source: Ambulatory Visit | Attending: Family Medicine | Admitting: Family Medicine

## 2024-05-04 DIAGNOSIS — E2839 Other primary ovarian failure: Secondary | ICD-10-CM | POA: Insufficient documentation

## 2025-03-27 ENCOUNTER — Ambulatory Visit: Admitting: Neurology
